# Patient Record
Sex: Female | Born: 1950 | Race: White | Hispanic: No | Marital: Married | Smoking: Current every day smoker
Health system: Southern US, Community
[De-identification: ages and names within clinical notes are randomized; demographics above are authoritative.]

## PROBLEM LIST (undated history)

## (undated) DIAGNOSIS — F329 Major depressive disorder, single episode, unspecified: Secondary | ICD-10-CM

## (undated) DIAGNOSIS — Z87442 Personal history of urinary calculi: Secondary | ICD-10-CM

## (undated) DIAGNOSIS — I10 Essential (primary) hypertension: Secondary | ICD-10-CM

## (undated) DIAGNOSIS — F32A Depression, unspecified: Secondary | ICD-10-CM

## (undated) DIAGNOSIS — E785 Hyperlipidemia, unspecified: Secondary | ICD-10-CM

## (undated) HISTORY — DX: Major depressive disorder, single episode, unspecified: F32.9

## (undated) HISTORY — DX: Depression, unspecified: F32.A

## (undated) HISTORY — DX: Hyperlipidemia, unspecified: E78.5

## (undated) HISTORY — DX: Morbid (severe) obesity due to excess calories: E66.01

---

## 1986-02-15 HISTORY — PX: ABDOMINAL HYSTERECTOMY: SHX81

## 2003-02-16 LAB — HM COLONOSCOPY: HM Colonoscopy: NORMAL

## 2006-10-14 ENCOUNTER — Ambulatory Visit: Payer: Self-pay | Admitting: Family Medicine

## 2006-12-17 LAB — HM DEXA SCAN

## 2008-03-21 ENCOUNTER — Ambulatory Visit: Payer: Self-pay | Admitting: Family Medicine

## 2010-03-23 LAB — HM PAP SMEAR: HM PAP: NORMAL

## 2010-05-11 ENCOUNTER — Ambulatory Visit: Payer: Self-pay | Admitting: Family Medicine

## 2010-05-13 ENCOUNTER — Ambulatory Visit: Payer: Self-pay | Admitting: Family Medicine

## 2011-07-05 ENCOUNTER — Ambulatory Visit: Payer: Self-pay | Admitting: Family Medicine

## 2011-07-05 LAB — HM MAMMOGRAPHY: HM MAMMO: NORMAL

## 2011-07-12 ENCOUNTER — Ambulatory Visit: Payer: Self-pay | Admitting: Family Medicine

## 2012-02-16 HISTORY — PX: KNEE SURGERY: SHX244

## 2012-09-19 ENCOUNTER — Ambulatory Visit: Payer: Self-pay | Admitting: Unknown Physician Specialty

## 2012-10-23 ENCOUNTER — Ambulatory Visit: Payer: Self-pay | Admitting: Anesthesiology

## 2012-10-25 ENCOUNTER — Ambulatory Visit: Payer: Self-pay | Admitting: Orthopedic Surgery

## 2012-10-25 LAB — POTASSIUM: Potassium: 4.9 mmol/L (ref 3.5–5.1)

## 2013-06-25 LAB — LIPID PANEL
CHOLESTEROL: 212 mg/dL — AB (ref 0–200)
HDL: 49 mg/dL (ref 35–70)
LDL CALC: 141 mg/dL
Triglycerides: 112 mg/dL (ref 40–160)

## 2013-06-25 LAB — HEMOGLOBIN A1C: HEMOGLOBIN A1C: 5.9 % (ref 4.0–6.0)

## 2014-06-07 NOTE — Op Note (Signed)
PATIENT NAME:  Veronica Mendez, Veronica Mendez MR#:  [id number] DATE OF BIRTH:  08-14-1950  DATE OF PROCEDURE:  10/25/2012  PREOPERATIVE DIAGNOSIS:  Left knee medial meniscus tear and osteoarthritis.   POSTOPERATIVE DIAGNOSIS:  Left knee medial meniscus tear and osteoarthritis.   PROCEDURE:  Left knee arthroscopy, partial medial meniscectomy.   ANESTHESIA:  General.   SURGEON:  Laurene Footman, M.D.   DESCRIPTION OF PROCEDURE:  The patient was brought to the Operating Room and after adequate anesthesia was obtained, the left leg was placed in the arthroscopic legholder with a tourniquet applied, but not required.  The leg was prepped and draped in the usual sterile fashion.  Appropriate patient identification, timeout procedures were completed.  An inferolateral portal was made and the arthroscope was introduced.  Initial inspection revealed mild patellofemoral chondromalacia with normal tracking.  No loose bodies within the gutters.  Coming around to the medial compartment, an inferomedial portal was made and a probe introduced.  There was a complex tear involving the posterior and middle thirds of the meniscus with a radial tear essentially out to near the capsule at the junction of the middle and posterior thirds.  There was mild chondromalacia in the medial compartment.  ACL was intact and lateral compartment was essentially normal.  A meniscal punch was then used to debride the medial meniscal punch and ArthroCare wand were used to debride it back to a stable margin.  There was a small Baker's cyst and with pressure it did appear that it was adequately decompressed with the partial meniscectomy.  The wound was then thoroughly irrigated with thorough irrigation of the joint.  All instrumentation was withdrawn.  The wounds were closed with simple interrupted 4-0 nylon skin sutures.  20 mL of 0.5% Sensorcaine infiltrated for postop analgesia.  Xeroform, 4 x 4's, Webril and Ace wrap applied.  The patient was sent to  the recovery room in stable condition.   ESTIMATED BLOOD LOSS:  Minimal.   COMPLICATIONS:  None.   SPECIMEN:  None.       ____________________________ Laurene Footman, MD mjm:ea D: 10/26/2012 02:06:09 ET T: 10/26/2012 03:27:15 ET JOB#: [id number]  cc: Laurene Footman, MD, <Dictator> Laurene Footman MD ELECTRONICALLY SIGNED 10/26/2012 11:12

## 2014-07-22 ENCOUNTER — Other Ambulatory Visit: Payer: Self-pay | Admitting: Family Medicine

## 2014-10-23 DIAGNOSIS — G4723 Circadian rhythm sleep disorder, irregular sleep wake type: Secondary | ICD-10-CM

## 2014-10-23 DIAGNOSIS — F411 Generalized anxiety disorder: Secondary | ICD-10-CM | POA: Insufficient documentation

## 2014-10-23 DIAGNOSIS — M158 Other polyosteoarthritis: Secondary | ICD-10-CM

## 2014-10-23 DIAGNOSIS — M1990 Unspecified osteoarthritis, unspecified site: Secondary | ICD-10-CM | POA: Insufficient documentation

## 2014-10-23 DIAGNOSIS — M545 Low back pain, unspecified: Secondary | ICD-10-CM | POA: Insufficient documentation

## 2014-10-23 DIAGNOSIS — E785 Hyperlipidemia, unspecified: Secondary | ICD-10-CM

## 2014-10-23 DIAGNOSIS — N951 Menopausal and female climacteric states: Secondary | ICD-10-CM | POA: Insufficient documentation

## 2014-10-23 DIAGNOSIS — J3089 Other allergic rhinitis: Secondary | ICD-10-CM | POA: Insufficient documentation

## 2014-10-23 DIAGNOSIS — F17200 Nicotine dependence, unspecified, uncomplicated: Secondary | ICD-10-CM

## 2014-10-23 DIAGNOSIS — F32A Depression, unspecified: Secondary | ICD-10-CM

## 2014-10-23 DIAGNOSIS — F329 Major depressive disorder, single episode, unspecified: Secondary | ICD-10-CM | POA: Insufficient documentation

## 2014-10-23 DIAGNOSIS — R928 Other abnormal and inconclusive findings on diagnostic imaging of breast: Secondary | ICD-10-CM

## 2014-10-23 DIAGNOSIS — E661 Drug-induced obesity: Secondary | ICD-10-CM | POA: Insufficient documentation

## 2014-10-23 DIAGNOSIS — E559 Vitamin D deficiency, unspecified: Secondary | ICD-10-CM

## 2014-10-23 DIAGNOSIS — E8881 Metabolic syndrome: Secondary | ICD-10-CM

## 2014-10-23 DIAGNOSIS — N952 Postmenopausal atrophic vaginitis: Secondary | ICD-10-CM | POA: Insufficient documentation

## 2014-10-23 DIAGNOSIS — N200 Calculus of kidney: Secondary | ICD-10-CM | POA: Insufficient documentation

## 2014-10-23 DIAGNOSIS — J309 Allergic rhinitis, unspecified: Secondary | ICD-10-CM

## 2014-10-23 DIAGNOSIS — I10 Essential (primary) hypertension: Secondary | ICD-10-CM | POA: Insufficient documentation

## 2014-10-23 DIAGNOSIS — G4700 Insomnia, unspecified: Secondary | ICD-10-CM | POA: Insufficient documentation

## 2014-10-23 DIAGNOSIS — M797 Fibromyalgia: Secondary | ICD-10-CM | POA: Insufficient documentation

## 2014-10-23 DIAGNOSIS — M6530 Trigger finger, unspecified finger: Secondary | ICD-10-CM | POA: Insufficient documentation

## 2014-10-23 DIAGNOSIS — G5600 Carpal tunnel syndrome, unspecified upper limb: Secondary | ICD-10-CM

## 2014-10-26 ENCOUNTER — Encounter: Payer: Self-pay | Admitting: Family Medicine

## 2014-10-26 DIAGNOSIS — E668 Other obesity: Secondary | ICD-10-CM | POA: Insufficient documentation

## 2014-10-26 DIAGNOSIS — M654 Radial styloid tenosynovitis [de Quervain]: Secondary | ICD-10-CM | POA: Insufficient documentation

## 2014-10-26 DIAGNOSIS — M47817 Spondylosis without myelopathy or radiculopathy, lumbosacral region: Secondary | ICD-10-CM | POA: Insufficient documentation

## 2014-10-26 DIAGNOSIS — E559 Vitamin D deficiency, unspecified: Secondary | ICD-10-CM | POA: Insufficient documentation

## 2014-10-28 ENCOUNTER — Encounter: Payer: Self-pay | Admitting: Family Medicine

## 2014-10-28 ENCOUNTER — Ambulatory Visit (INDEPENDENT_AMBULATORY_CARE_PROVIDER_SITE_OTHER): Payer: Federal, State, Local not specified - PPO | Admitting: Family Medicine

## 2014-10-28 VITALS — BP 122/68 | HR 89 | Temp 98.1°F | Resp 16 | Ht 63.0 in | Wt 237.5 lb

## 2014-10-28 DIAGNOSIS — M797 Fibromyalgia: Secondary | ICD-10-CM | POA: Diagnosis not present

## 2014-10-28 DIAGNOSIS — G4700 Insomnia, unspecified: Secondary | ICD-10-CM

## 2014-10-28 DIAGNOSIS — Z1211 Encounter for screening for malignant neoplasm of colon: Secondary | ICD-10-CM

## 2014-10-28 DIAGNOSIS — F411 Generalized anxiety disorder: Secondary | ICD-10-CM | POA: Diagnosis not present

## 2014-10-28 DIAGNOSIS — J309 Allergic rhinitis, unspecified: Secondary | ICD-10-CM | POA: Diagnosis not present

## 2014-10-28 DIAGNOSIS — J3089 Other allergic rhinitis: Secondary | ICD-10-CM

## 2014-10-28 DIAGNOSIS — E785 Hyperlipidemia, unspecified: Secondary | ICD-10-CM | POA: Diagnosis not present

## 2014-10-28 DIAGNOSIS — I10 Essential (primary) hypertension: Secondary | ICD-10-CM | POA: Diagnosis not present

## 2014-10-28 DIAGNOSIS — Z1239 Encounter for other screening for malignant neoplasm of breast: Secondary | ICD-10-CM | POA: Diagnosis not present

## 2014-10-28 DIAGNOSIS — Z23 Encounter for immunization: Secondary | ICD-10-CM

## 2014-10-28 DIAGNOSIS — M5412 Radiculopathy, cervical region: Secondary | ICD-10-CM | POA: Diagnosis not present

## 2014-10-28 MED ORDER — ESCITALOPRAM OXALATE 20 MG PO TABS: 20.0000 mg | ORAL_TABLET | Freq: Every day | ORAL | Status: DC

## 2014-10-28 MED ORDER — LEVOCETIRIZINE DIHYDROCHLORIDE 5 MG PO TABS: 5.0000 mg | ORAL_TABLET | Freq: Every day | ORAL | Status: DC

## 2014-10-28 MED ORDER — VALSARTAN-HYDROCHLOROTHIAZIDE 320-25 MG PO TABS: 1.0000 | ORAL_TABLET | Freq: Every day | ORAL | Status: DC

## 2014-10-28 MED ORDER — HYDROCODONE-ACETAMINOPHEN 5-325 MG PO TABS: 1.0000 | ORAL_TABLET | Freq: Four times a day (QID) | ORAL | Status: DC | PRN

## 2014-10-28 MED ORDER — PREDNISONE 10 MG (48) PO TBPK: ORAL_TABLET | Freq: Every day | ORAL | Status: DC

## 2014-10-28 MED ORDER — ALPRAZOLAM 0.5 MG PO TABS: 0.5000 mg | ORAL_TABLET | ORAL | Status: DC | PRN

## 2014-10-28 NOTE — Progress Notes (Signed)
Name: Veronica Mendez   MRN: [id number]    DOB: 1950/03/08   Date:10/28/2014       Progress Note  Subjective  Chief Complaint  Chief Complaint  Patient presents with  . Medication Refill    follow-up  . Hypertension  . Hyperlipidemia  . Depression  . Anxiety  . Allergic Rhinitis   . Neck Pain    onset 3-weeks seeing chiropractor which states vertebrae out of place affecting her right should and arm    HPI  HTN: bp is at goal, but Benicar HCTZ, however she states the medication is too expensive and would like to change to a cheaper medication.   Denies side effects of medication   Hyperlipidemia: taking Pravastatin daily now, denies myalgia.   Depression/GAD: she states depression is under control, she retired now, and is feeling well, worries all the time, she is an anxious person, but is stable at this time. Sometimes she smokes because of stress, advised to take alprazolam prn   AR: she is taking medication and states symptoms are controlled  FMS: she feels better during Summer months, she states her body pain is about 4 /10   Cervical radiculitis: symptoms started 5 weeks ago, she felt at home - tripped on a cord that was in the middle of the floor- she has towels on her hands, and fell on her face. She states that when she got up she was feeling well, but one week later she started to have some neck stiffness and got progressively worse. She went to the chiropractor 3 weeks ago ( Dr. Freddi Che ) but pain is not improving. The pain goes from her neck to left shoulder blade , that is described an aching, that goes down to left arm, and numbness down to 4th and 5th fingers of left hand. Pain improves when she places her left hand above her head.     Patient Active Problem List   Diagnosis Date Noted  . Radiculitis of left cervical region 10/28/2014  . Vitamin D deficiency 10/26/2014  . Lumbosacral spondylosis without myelopathy 10/26/2014  . Extreme obesity 10/26/2014  . De  Quervain's disease (radial styloid tenosynovitis) 10/26/2014  . Nephrolithiasis 10/23/2014  . Trigger finger, right 10/23/2014  . Carpal tunnel syndrome 10/23/2014  . Generalized anxiety disorder 10/23/2014  . Depression 10/23/2014  . Fibromyalgia 10/23/2014  . Perennial allergic rhinitis 10/23/2014  . Dyslipidemia 10/23/2014  . Benign hypertension 10/23/2014  . Morbid drug-induced obesity 10/23/2014  . 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 Insomnia 10/23/2014  . Metabolic syndrome 78/29/5621  . Tobacco use disorder 10/23/2014  . Menopause syndrome 10/23/2014  . Lumbago 10/23/2014  . Osteoarthritis 10/23/2014  . Atrophic vaginitis 10/23/2014    History reviewed. No pertinent past surgical history.  History reviewed. No pertinent family history.  Social History   Social History  . Marital Status: Married    Spouse Name: N/A  . Number of Children: N/A  . Years of Education: N/A   Occupational History  . Not on file.   Social History Main Topics  . Smoking status: Current Some Day Smoker  . Smokeless tobacco: Never Used  . Alcohol Use: 0.0 oz/week    0 Standard drinks or equivalent per week  . Drug Use: No  . Sexual Activity: Yes   Other Topics Concern  . Not on file   Social History Narrative  . No narrative on file     Current outpatient prescriptions:  .  ALPRAZolam (XANAX) 0.5  MG tablet, Take 1 tablet (0.5 mg total) by mouth as needed for anxiety., Disp: 60 tablet, Rfl: 0 .  Cholecalciferol (VITAMIN D) 2000 UNITS CAPS, Take 1 tablet by mouth daily., Disp: , Rfl:  .  escitalopram (LEXAPRO) 20 MG tablet, Take 1 tablet (20 mg total) by mouth daily., Disp: 90 tablet, Rfl: 1 .  HYDROcodone-acetaminophen (NORCO/VICODIN) 5-325 MG per tablet, Take 1 tablet by mouth every 6 (six) hours as needed for severe pain., Disp: 20 tablet, Rfl: 0 .  ibuprofen (ADVIL,MOTRIN) 800 MG tablet, Take 1 tablet by mouth as needed., Disp: , Rfl:  .  levocetirizine (XYZAL) 5 MG  tablet, Take 1 tablet (5 mg total) by mouth daily., Disp: 90 tablet, Rfl: 1 .  pravastatin (PRAVACHOL) 40 MG tablet, TAKE 1 TABLET BY MOUTH EVERY EVENING, Disp: 90 tablet, Rfl: 1 .  vitamin E 100 UNIT capsule, Take by mouth daily., Disp: , Rfl:  .  predniSONE (STERAPRED UNI-PAK 48 TAB) 10 MG (48) TBPK tablet, Take by mouth daily., Disp: 48 tablet, Rfl: 0 .  valsartan-hydrochlorothiazide (DIOVAN-HCT) 320-25 MG per tablet, Take 1 tablet by mouth daily., Disp: 90 tablet, Rfl: 1  No Active Allergies   ROS  Constitutional: Negative for fever - gained weight since last visit  Respiratory: Negative for cough and shortness of breath.   Cardiovascular: Negative for chest pain or palpitations.  Gastrointestinal: Negative for abdominal pain, no bowel changes.  Musculoskeletal: Negative for gait problem or joint swelling.  Skin: Negative for rash.  Neurological: Negative for dizziness or headache.  No other specific complaints in a complete review of systems (except as listed in HPI above).  Objective  Filed Vitals:   10/28/14 0913  BP: 122/68  Pulse: 89  Temp: 98.1 F (36.7 C)  TempSrc: Oral  Resp: 16  Height: 5\' 3"  (1.6 m)  Weight: 237 lb 8 oz (107.729 kg)  SpO2: 95%    Body mass index is 42.08 kg/(m^2).  Physical Exam  Constitutional: Patient appears well-developed and well-nourished. Obese No distress.  HEENT: head atraumatic, normocephalic, pupils equal and reactive to light, throat within normal limits Cardiovascular: Normal rate, regular rhythm and normal heart sounds.  No murmur heard. No BLE edema. Pulmonary/Chest: Effort normal and breath sounds normal. No respiratory distress. Abdominal: Soft.  There is no tenderness. Psychiatric: Patient has a normal mood and affect. behavior is normal. Judgment and thought content normal. Muscular Skeletal: pain during palpation of neck, decrease rom of neck, normal grip, paresthesia left lateral upper arm ( triceps area  )   PHQ2/9: Depression screen PHQ 2/9 10/28/2014  Decreased Interest 0  Down, Depressed, Hopeless 0  PHQ - 2 Score 0     Fall Risk: Fall Risk  10/28/2014  Falls in the past year? Yes  Number falls in past yr: 1  Injury with Fall? Yes      Functional Status Survey: Is the patient deaf or have difficulty hearing?: No Does the patient have difficulty seeing, even when wearing glasses/contacts?: Yes (glasses) Does the patient have difficulty concentrating, remembering, or making decisions?: No Does the patient have difficulty walking or climbing stairs?: No Does the patient have difficulty dressing or bathing?: No Does the patient have difficulty doing errands alone such as visiting a doctor's office or shopping?: No    Assessment & Plan  1. Benign hypertension Change because of cost - valsartan-hydrochlorothiazide (DIOVAN-HCT) 320-25 MG per tablet; Take 1 tablet by mouth daily.  Dispense: 90 tablet; Refill: 1  2. Needs flu  shot  - Flu Vaccine QUAD 36+ mos PF IM (Fluarix & Fluzone Quad PF)  3. Morbid  obesity Needs to decrease portion size, give her left overs away - she likes to eat food, exercise more  4. Fibromyalgia stable  5. Dyslipidemia Continue medication  6. Radiculitis of left cervical region  - HYDROcodone-acetaminophen (NORCO/VICODIN) 5-325 MG per tablet; Take 1 tablet by mouth every 6 (six) hours as needed for severe pain.  Dispense: 20 tablet; Refill: 0 - predniSONE (STERAPRED UNI-PAK 48 TAB) 10 MG (48) TBPK tablet; Take by mouth daily.  Dispense: 48 tablet; Refill: 0  7. Insomnia Doing better, now that she quit smoking  8. Generalized anxiety disorder Continue medication, advised to try taking half of Alprazolam when needed to avoid smoking when anxious. #60 to last at least 3 months - escitalopram (LEXAPRO) 20 MG tablet; Take 1 tablet (20 mg total) by mouth daily.  Dispense: 90 tablet; Refill: 1 - ALPRAZolam (XANAX) 0.5 MG tablet; Take 1 tablet  (0.5 mg total) by mouth as needed for anxiety.  Dispense: 60 tablet; Refill: 0  9. Perennial allergic rhinitis  - levocetirizine (XYZAL) 5 MG tablet; Take 1 tablet (5 mg total) by mouth daily.  Dispense: 90 tablet; Refill: 1  10. Colon cancer screening She refused colonoscopy, we will try Colguard, she will check about coverage with her insurance first - Cologuard  11. Breast cancer screening  - MM Digital Screening; Future

## 2015-01-19 ENCOUNTER — Other Ambulatory Visit: Payer: Self-pay | Admitting: Family Medicine

## 2015-01-20 NOTE — Telephone Encounter (Signed)
Patient requesting refill. 

## 2015-03-19 ENCOUNTER — Encounter: Payer: Federal, State, Local not specified - PPO | Admitting: Family Medicine

## 2015-05-09 ENCOUNTER — Encounter: Payer: Self-pay | Admitting: Family Medicine

## 2015-05-09 ENCOUNTER — Ambulatory Visit (INDEPENDENT_AMBULATORY_CARE_PROVIDER_SITE_OTHER): Payer: Federal, State, Local not specified - PPO | Admitting: Family Medicine

## 2015-05-09 VITALS — BP 116/70 | HR 92 | Temp 98.6°F | Resp 16 | Wt 247.2 lb

## 2015-05-09 DIAGNOSIS — Z0000 Encounter for general adult medical examination without abnormal findings: Secondary | ICD-10-CM | POA: Diagnosis not present

## 2015-05-09 DIAGNOSIS — J3089 Other allergic rhinitis: Secondary | ICD-10-CM

## 2015-05-09 DIAGNOSIS — M797 Fibromyalgia: Secondary | ICD-10-CM

## 2015-05-09 DIAGNOSIS — Z1211 Encounter for screening for malignant neoplasm of colon: Secondary | ICD-10-CM

## 2015-05-09 DIAGNOSIS — Z1239 Encounter for other screening for malignant neoplasm of breast: Secondary | ICD-10-CM

## 2015-05-09 DIAGNOSIS — G4700 Insomnia, unspecified: Secondary | ICD-10-CM

## 2015-05-09 DIAGNOSIS — Z23 Encounter for immunization: Secondary | ICD-10-CM

## 2015-05-09 DIAGNOSIS — I10 Essential (primary) hypertension: Secondary | ICD-10-CM | POA: Diagnosis not present

## 2015-05-09 DIAGNOSIS — E785 Hyperlipidemia, unspecified: Secondary | ICD-10-CM | POA: Diagnosis not present

## 2015-05-09 DIAGNOSIS — J309 Allergic rhinitis, unspecified: Secondary | ICD-10-CM

## 2015-05-09 DIAGNOSIS — E8881 Metabolic syndrome: Secondary | ICD-10-CM

## 2015-05-09 DIAGNOSIS — E559 Vitamin D deficiency, unspecified: Secondary | ICD-10-CM | POA: Diagnosis not present

## 2015-05-09 DIAGNOSIS — F411 Generalized anxiety disorder: Secondary | ICD-10-CM | POA: Diagnosis not present

## 2015-05-09 DIAGNOSIS — M5412 Radiculopathy, cervical region: Secondary | ICD-10-CM

## 2015-05-09 DIAGNOSIS — Z01419 Encounter for gynecological examination (general) (routine) without abnormal findings: Secondary | ICD-10-CM

## 2015-05-09 DIAGNOSIS — R928 Other abnormal and inconclusive findings on diagnostic imaging of breast: Secondary | ICD-10-CM

## 2015-05-09 MED ORDER — PRAVASTATIN SODIUM 40 MG PO TABS: 40.0000 mg | ORAL_TABLET | Freq: Every evening | ORAL | Status: DC

## 2015-05-09 MED ORDER — HYDROCODONE-ACETAMINOPHEN 5-325 MG PO TABS: 1.0000 | ORAL_TABLET | Freq: Four times a day (QID) | ORAL | Status: DC | PRN

## 2015-05-09 MED ORDER — ESCITALOPRAM OXALATE 20 MG PO TABS
20.0000 mg | ORAL_TABLET | Freq: Every day | ORAL | Status: DC
Start: 2015-05-09 — End: 2015-10-31

## 2015-05-09 MED ORDER — LEVOCETIRIZINE DIHYDROCHLORIDE 5 MG PO TABS: 5.0000 mg | ORAL_TABLET | Freq: Every day | ORAL | Status: DC

## 2015-05-09 MED ORDER — ALPRAZOLAM 0.5 MG PO TABS: 0.5000 mg | ORAL_TABLET | ORAL | Status: DC | PRN

## 2015-05-09 MED ORDER — VALSARTAN-HYDROCHLOROTHIAZIDE 320-25 MG PO TABS: 1.0000 | ORAL_TABLET | Freq: Every day | ORAL | Status: DC

## 2015-05-09 NOTE — Progress Notes (Signed)
Name: Veronica Mendez   MRN: [id number]    DOB: Nov 22, 1950   Date:05/09/2015       Progress Note  Subjective  Chief Complaint  Chief Complaint  Patient presents with  . Annual Exam    HPI  Well woman: needs colonoscopy and mammogram ( previous ones checked by Dr. Marina Gravel ), no breast symptoms,no vaginal discharge, she has urinary frequency ( but because she drinks a lot of water ) no urgency, no incontinence. Not currently sexually active.  HTN: bp is at goal, but Benicar HCTZ, however she states the medication is too expensive and would like to change to a cheaper medication.No chest pain or palpitation  Denies side effects of medication   Hyperlipidemia: taking Pravastatin daily now, denies myalgia.   Depression/GAD: she states depression is under control, she retired now, and is feeling well, still  worries all the time, she is an anxious person, she was feeling worse for a period of time carrying for her mother-in-law but is back to baseline now.  Sometimes she smokes because of stress, advised to take alprazolam prn   AR: she is taking medication and states symptoms are controlled. No nasal congestion, occasional nasal congestion and rhinorrhea, when outside in the yard  FMS: she feels better during Summer months, the cold weather makes symptoms worse. She states her body pain is about 0/10, she is doing better since she retired because she can rest more often during the day, average 2/10 , taking more Ibuprofen to control symptoms. - taking twice daily. Very seldom takes Hydrocodone  Obesity: she has gained 10 lbs, she states she loves to eat.    Cervical Radiculitis: used to go down left arm, still going to chiropractor once a month and is doing well at this time, no weakness or tingling at this time. She takes medication prn only   Patient Active Problem List   Diagnosis Date Noted  . Radiculitis of left cervical region 10/28/2014  . Vitamin D deficiency 10/26/2014  .  Lumbosacral spondylosis without myelopathy 10/26/2014  . Extreme obesity ([place]) 10/26/2014  . De Quervain's disease (radial styloid tenosynovitis) 10/26/2014  . Nephrolithiasis 10/23/2014  . Trigger finger, right 10/23/2014  . Carpal tunnel syndrome 10/23/2014  . Generalized anxiety disorder 10/23/2014  . Depression 10/23/2014  . Fibromyalgia 10/23/2014  . Perennial allergic rhinitis 10/23/2014  . Dyslipidemia 10/23/2014  . Benign hypertension 10/23/2014  . Morbid drug-induced obesity 10/23/2014  . 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 Insomnia 10/23/2014  . Metabolic syndrome 99991111  . Tobacco use disorder 10/23/2014  . Menopause syndrome 10/23/2014  . Lumbago 10/23/2014  . Osteoarthritis 10/23/2014  . Atrophic vaginitis 10/23/2014    History reviewed. No pertinent past surgical history.  History reviewed. No pertinent family history.  Social History   Social History  . Marital Status: Married    Spouse Name: N/A  . Number of Children: N/A  . Years of Education: N/A   Occupational History  . Not on file.   Social History Main Topics  . Smoking status: Current Some Day Smoker  . Smokeless tobacco: Never Used  . Alcohol Use: 0.0 oz/week    0 Standard drinks or equivalent per week  . Drug Use: No  . Sexual Activity: Yes   Other Topics Concern  . Not on file   Social History Narrative     Current outpatient prescriptions:  .  ALPRAZolam (XANAX) 0.5 MG tablet, Take 1 tablet (0.5 mg total) by mouth as needed for  anxiety., Disp: 60 tablet, Rfl: 0 .  Calcium Citrate-Vitamin D (CALCIUM + D PO), Take by mouth., Disp: , Rfl:  .  Cholecalciferol (VITAMIN D) 2000 UNITS CAPS, Take 1 tablet by mouth daily., Disp: , Rfl:  .  escitalopram (LEXAPRO) 20 MG tablet, Take 1 tablet (20 mg total) by mouth daily., Disp: 90 tablet, Rfl: 1 .  ibuprofen (ADVIL,MOTRIN) 800 MG tablet, Take 1 tablet by mouth as needed. Reported on 05/09/2015, Disp: , Rfl:  .  levocetirizine  (XYZAL) 5 MG tablet, Take 1 tablet (5 mg total) by mouth daily., Disp: 90 tablet, Rfl: 1 .  pravastatin (PRAVACHOL) 40 MG tablet, TAKE 1 TABLET BY MOUTH EVERY EVENING, Disp: 90 tablet, Rfl: 1 .  valsartan-hydrochlorothiazide (DIOVAN-HCT) 320-25 MG per tablet, Take 1 tablet by mouth daily., Disp: 90 tablet, Rfl: 1 .  vitamin E 100 UNIT capsule, Take by mouth daily., Disp: , Rfl:  .  HYDROcodone-acetaminophen (NORCO/VICODIN) 5-325 MG per tablet, Take 1 tablet by mouth every 6 (six) hours as needed for severe pain. (Patient not taking: Reported on 05/09/2015), Disp: 20 tablet, Rfl: 0  No Known Allergies   ROS  Constitutional: Negative for fever, positive for weight change.  Respiratory: Negative for cough and shortness of breath.   Cardiovascular: Negative for chest pain or palpitations.  Gastrointestinal: Negative for abdominal pain, no bowel changes.  Musculoskeletal: Negative for gait problem or joint swelling.  Skin: Negative for rash.  Neurological: Negative for dizziness or headache.  No other specific complaints in a complete review of systems (except as listed in HPI above).  Objective  Filed Vitals:   05/09/15 1108  BP: 116/70  Pulse: 92  Temp: 98.6 F (37 C)  TempSrc: Oral  Resp: 16  Weight: 247 lb 3.2 oz (112.129 kg)  SpO2: 98%    Body mass index is 43.8 kg/(m^2).  Physical Exam  Constitutional: Patient appears well-developed and obese. No distress.  HENT: Head: Normocephalic and atraumatic. Ears: B TMs ok, no erythema or effusion; Nose: Nose normal. Mouth/Throat: Oropharynx is clear and moist. No oropharyngeal exudate.  Eyes: Conjunctivae and EOM are normal. Pupils are equal, round, and reactive to light. No scleral icterus.  Neck: Normal range of motion. Neck supple. No JVD present. No thyromegaly present.  Cardiovascular: Normal rate, regular rhythm and normal heart sounds.  No murmur heard. No BLE edema. Pulmonary/Chest: Effort normal and breath sounds normal. No  respiratory distress. Abdominal: Soft. Bowel sounds are normal, no distension. There is no tenderness. no masses Breast: no lumps or masses, no nipple discharge or rashes FEMALE GENITALIA:  External genitalia normal External urethra normal RECTAL: not done Musculoskeletal: Normal range of motion, no joint effusions. No gross deformities Neurological: he is alert and oriented to person, place, and time. No cranial nerve deficit. Coordination, balance, strength, speech and gait are normal.  Skin: Skin is warm and dry. No rash noted. No erythema. She has multiple moles, she keeps a check - no changes, previously seen by Dermatologist and denies skin cancer Psychiatric: Patient has a normal mood and affect. behavior is normal. Judgment and thought content normal.   PHQ2/9: Depression screen [facility] 2/9 05/09/2015 10/28/2014  Decreased Interest 0 0  Down, Depressed, Hopeless 1 0  PHQ - 2 Score 1 0     Fall Risk: Fall Risk  05/09/2015 10/28/2014  Falls in the past year? No Yes  Number falls in past yr: - 1  Injury with Fall? - Yes     Functional Status Survey:  Is the patient deaf or have difficulty hearing?: No Does the patient have difficulty seeing, even when wearing glasses/contacts?: No Does the patient have difficulty concentrating, remembering, or making decisions?: No Does the patient have difficulty walking or climbing stairs?: No Does the patient have difficulty dressing or bathing?: No Does the patient have difficulty doing errands alone such as visiting a doctor's office or shopping?: No    Assessment & Plan  1. Well woman exam  Discussed importance of 150 minutes of physical activity weekly, eat two servings of fish weekly, eat one serving of tree nuts ( cashews, pistachios, pecans, almonds.Marland Kitchen) every other day, eat 6 servings of fruit/vegetables daily and drink plenty of water and avoid sweet beverages.   2. Generalized anxiety disorder  - escitalopram (LEXAPRO) 20 MG  tablet; Take 1 tablet (20 mg total) by mouth daily.  Dispense: 90 tablet; Refill: 1 - ALPRAZolam (XANAX) 0.5 MG tablet; Take 1 tablet (0.5 mg total) by mouth as needed for anxiety.  Dispense: 60 tablet; Refill: 0  3. Perennial allergic rhinitis  - levocetirizine (XYZAL) 5 MG tablet; Take 1 tablet (5 mg total) by mouth daily.  Dispense: 90 tablet; Refill: 1  4. Benign hypertension  - valsartan-hydrochlorothiazide (DIOVAN-HCT) 320-25 MG tablet; Take 1 tablet by mouth daily.  Dispense: 90 tablet; Refill: 1 - Comprehensive metabolic panel  5. Breast cancer screening  - MM Digital Screening; Future  6. Colon cancer screening   - Ambulatory referral to Gastroenterology  7. Metabolic syndrome  - Hemoglobin A1c  8. Insomnia  Stable at this time  9. Dyslipidemia  - Lipid panel  10. Vitamin D deficiency  - VITAMIN D 25 Hydroxy (Vit-D Deficiency, Fractures)  11. Need for pneumococcal vaccination  - Pneumococcal conjugate vaccine 13-valent IM  12. Morbid obesity, unspecified obesity type ([place])  Discussed life style modification   13. Radiculitis of left cervical region  - HYDROcodone-acetaminophen (NORCO/VICODIN) 5-325 MG tablet; Take 1 tablet by mouth every 6 (six) hours as needed for severe pain.  Dispense: 20 tablet; Refill: 0  14. Fibromyalgia  A little worse when cold

## 2015-05-10 LAB — HEMOGLOBIN A1C
ESTIMATED AVERAGE GLUCOSE: 120 mg/dL
Hgb A1c MFr Bld: 5.8 % — ABNORMAL HIGH (ref 4.8–5.6)

## 2015-05-10 LAB — COMPREHENSIVE METABOLIC PANEL
A/G RATIO: 1.9 (ref 1.2–2.2)
ALBUMIN: 4.4 g/dL (ref 3.6–4.8)
ALT: 28 IU/L (ref 0–32)
AST: 22 IU/L (ref 0–40)
Alkaline Phosphatase: 90 IU/L (ref 39–117)
BUN/Creatinine Ratio: 31 — ABNORMAL HIGH (ref 11–26)
BUN: 19 mg/dL (ref 8–27)
Bilirubin Total: 0.2 mg/dL (ref 0.0–1.2)
CALCIUM: 10 mg/dL (ref 8.7–10.3)
CO2: 24 mmol/L (ref 18–29)
CREATININE: 0.61 mg/dL (ref 0.57–1.00)
Chloride: 101 mmol/L (ref 96–106)
GFR, EST AFRICAN AMERICAN: 110 mL/min/{1.73_m2} (ref >59)
GFR, EST NON AFRICAN AMERICAN: 95 mL/min/{1.73_m2} (ref >59)
GLOBULIN, TOTAL: 2.3 g/dL (ref 1.5–4.5)
Glucose: 95 mg/dL (ref 65–99)
POTASSIUM: 5.3 mmol/L — AB (ref 3.5–5.2)
SODIUM: 140 mmol/L (ref 134–144)
TOTAL PROTEIN: 6.7 g/dL (ref 6.0–8.5)

## 2015-05-10 LAB — LIPID PANEL
CHOLESTEROL TOTAL: 185 mg/dL (ref 100–199)
Chol/HDL Ratio: 3.6 ratio units (ref 0.0–4.4)
HDL: 52 mg/dL (ref >39)
LDL Calculated: 106 mg/dL — ABNORMAL HIGH (ref 0–99)
TRIGLYCERIDES: 134 mg/dL (ref 0–149)
VLDL Cholesterol Cal: 27 mg/dL (ref 5–40)

## 2015-05-10 LAB — VITAMIN D 25 HYDROXY (VIT D DEFICIENCY, FRACTURES): Vit D, 25-Hydroxy: 24.5 ng/mL — ABNORMAL LOW (ref 30.0–100.0)

## 2015-05-14 ENCOUNTER — Other Ambulatory Visit: Payer: Self-pay

## 2015-05-14 ENCOUNTER — Telehealth: Payer: Self-pay

## 2015-05-14 NOTE — Telephone Encounter (Signed)
Gastroenterology Pre-Procedure Review  Request Date: 06/05/15 Requesting Physician: Dr. Ancil Boozer  PATIENT REVIEW QUESTIONS: The patient responded to the following health history questions as indicated:    1. Are you having any GI issues? no 2. Do you have a personal history of Polyps? no 3. Do you have a family history of Colon Cancer or Polyps? no 4. Diabetes Mellitus? no 5. Joint replacements in the past 12 months?no 6. Major health problems in the past 3 months?no 7. Any artificial heart valves, MVP, or defibrillator?no    MEDICATIONS & ALLERGIES:    Patient reports the following regarding taking any anticoagulation/antiplatelet therapy:   Plavix, Coumadin, Eliquis, Xarelto, Lovenox, Pradaxa, Brilinta, or Effient? no Aspirin? no  Patient confirms/reports the following medications:  Current Outpatient Prescriptions  Medication Sig Dispense Refill  . ALPRAZolam (XANAX) 0.5 MG tablet Take 1 tablet (0.5 mg total) by mouth as needed for anxiety. 60 tablet 0  . Calcium Citrate-Vitamin D (CALCIUM + D PO) Take by mouth.    . Cholecalciferol (VITAMIN D) 2000 UNITS CAPS Take 1 tablet by mouth daily.    Marland Kitchen escitalopram (LEXAPRO) 20 MG tablet Take 1 tablet (20 mg total) by mouth daily. 90 tablet 1  . HYDROcodone-acetaminophen (NORCO/VICODIN) 5-325 MG tablet Take 1 tablet by mouth every 6 (six) hours as needed for severe pain. 20 tablet 0  . ibuprofen (ADVIL,MOTRIN) 800 MG tablet Take 1 tablet by mouth as needed. Reported on 05/09/2015    . levocetirizine (XYZAL) 5 MG tablet Take 1 tablet (5 mg total) by mouth daily. 90 tablet 1  . pravastatin (PRAVACHOL) 40 MG tablet Take 1 tablet (40 mg total) by mouth every evening. 90 tablet 1  . valsartan-hydrochlorothiazide (DIOVAN-HCT) 320-25 MG tablet Take 1 tablet by mouth daily. 90 tablet 1  . vitamin E 100 UNIT capsule Take by mouth daily.     No current facility-administered medications for this visit.    Patient confirms/reports the following  allergies:  No Known Allergies  No orders of the defined types were placed in this encounter.    AUTHORIZATION INFORMATION Primary Insurance: 1D#: Group #:  Secondary Insurance: 1D#: Group #:  SCHEDULE INFORMATION: Date: 06/05/15 Time: Location: [place]

## 2015-05-28 ENCOUNTER — Other Ambulatory Visit: Payer: Self-pay | Admitting: Family Medicine

## 2015-05-28 NOTE — Telephone Encounter (Signed)
Patient requesting refill. 

## 2015-05-29 ENCOUNTER — Telehealth: Payer: Self-pay | Admitting: Gastroenterology

## 2015-05-29 ENCOUNTER — Telehealth: Payer: Self-pay | Admitting: Family Medicine

## 2015-05-29 NOTE — Telephone Encounter (Signed)
Patient left a voice message that she would like to talk to you. No reason given. °

## 2015-05-29 NOTE — Telephone Encounter (Signed)
Results were printed and mailed to the address on file.

## 2015-05-29 NOTE — Telephone Encounter (Signed)
Requesting that you mail a copy of her most recent lab results. Address on file is correct.

## 2015-06-02 NOTE — Telephone Encounter (Signed)
Pt notified about the precert for her colonoscopy that is scheduled for Thursday. Advised her she should contact them to check benefits for other services that will be provided that day.

## 2015-06-02 NOTE — Telephone Encounter (Signed)
No authorization is required with [facility] for CPT: [id number] as long as it is done on an outpatient bases.

## 2015-06-02 NOTE — Telephone Encounter (Signed)
Pt wanted to make sure her colonoscopy was pre certified. Will you check to see if precert if required?

## 2015-06-03 ENCOUNTER — Telehealth: Payer: Self-pay | Admitting: Gastroenterology

## 2015-06-03 NOTE — Telephone Encounter (Signed)
Patient called to cancel her colonoscopy due to an illness. She will call back to reschedule. Called [facility] to let them know.

## 2015-06-03 NOTE — Telephone Encounter (Signed)
Noted! Thank you

## 2015-06-04 NOTE — Discharge Instructions (Signed)

## 2015-06-05 ENCOUNTER — Ambulatory Visit
Admission: RE | Admit: 2015-06-05 | Payer: Federal, State, Local not specified - PPO | Source: Ambulatory Visit | Admitting: Gastroenterology

## 2015-06-05 ENCOUNTER — Encounter: Admission: RE | Payer: Self-pay | Source: Ambulatory Visit

## 2015-06-05 SURGERY — COLONOSCOPY WITH PROPOFOL
Anesthesia: Choice

## 2015-07-09 ENCOUNTER — Encounter: Payer: Self-pay | Admitting: Family Medicine

## 2015-07-09 DIAGNOSIS — R739 Hyperglycemia, unspecified: Secondary | ICD-10-CM | POA: Insufficient documentation

## 2015-10-07 ENCOUNTER — Ambulatory Visit: Payer: Federal, State, Local not specified - PPO

## 2015-10-07 ENCOUNTER — Other Ambulatory Visit: Payer: Federal, State, Local not specified - PPO

## 2015-10-31 ENCOUNTER — Other Ambulatory Visit: Payer: Self-pay | Admitting: Family Medicine

## 2015-10-31 DIAGNOSIS — J3089 Other allergic rhinitis: Secondary | ICD-10-CM

## 2015-10-31 DIAGNOSIS — F411 Generalized anxiety disorder: Secondary | ICD-10-CM

## 2015-10-31 DIAGNOSIS — I10 Essential (primary) hypertension: Secondary | ICD-10-CM

## 2015-11-10 ENCOUNTER — Ambulatory Visit (INDEPENDENT_AMBULATORY_CARE_PROVIDER_SITE_OTHER): Payer: Federal, State, Local not specified - PPO | Admitting: Family Medicine

## 2015-11-10 ENCOUNTER — Encounter: Payer: Self-pay | Admitting: Family Medicine

## 2015-11-10 VITALS — BP 118/74 | HR 100 | Temp 98.3°F | Resp 16 | Ht 63.0 in | Wt 244.2 lb

## 2015-11-10 DIAGNOSIS — M797 Fibromyalgia: Secondary | ICD-10-CM | POA: Diagnosis not present

## 2015-11-10 DIAGNOSIS — I10 Essential (primary) hypertension: Secondary | ICD-10-CM

## 2015-11-10 DIAGNOSIS — E8881 Metabolic syndrome: Secondary | ICD-10-CM

## 2015-11-10 DIAGNOSIS — F411 Generalized anxiety disorder: Secondary | ICD-10-CM | POA: Diagnosis not present

## 2015-11-10 DIAGNOSIS — E66813 Obesity, class 3: Secondary | ICD-10-CM

## 2015-11-10 DIAGNOSIS — F5081 Binge eating disorder: Secondary | ICD-10-CM | POA: Diagnosis not present

## 2015-11-10 DIAGNOSIS — G4700 Insomnia, unspecified: Secondary | ICD-10-CM | POA: Diagnosis not present

## 2015-11-10 DIAGNOSIS — E785 Hyperlipidemia, unspecified: Secondary | ICD-10-CM

## 2015-11-10 DIAGNOSIS — Z23 Encounter for immunization: Secondary | ICD-10-CM | POA: Diagnosis not present

## 2015-11-10 HISTORY — DX: Morbid (severe) obesity due to excess calories: E66.01

## 2015-11-10 HISTORY — DX: Obesity, class 3: E66.813

## 2015-11-10 MED ORDER — PRAVASTATIN SODIUM 40 MG PO TABS: 40.0000 mg | ORAL_TABLET | Freq: Every evening | ORAL | 1 refills | Status: DC

## 2015-11-10 MED ORDER — LISDEXAMFETAMINE DIMESYLATE 20 MG PO CAPS: 20.0000 mg | ORAL_CAPSULE | Freq: Every day | ORAL | 0 refills | Status: DC

## 2015-11-10 NOTE — Progress Notes (Signed)
Name: Veronica Mendez   MRN: [id number]    DOB: 1950-02-17   Date:11/10/2015       Progress Note  Subjective  Chief Complaint  Chief Complaint  Patient presents with  . Hypertension    pt here for 6 month follow up  . Hyperlipidemia  . Pain  . Flu Vaccine  . Depression    HPI  HTN: bp is at goal, taking Valsartan HCTZ and bp is at goal, she denies chest pain or palpitation   Hyperlipidemia: taking Pravastatin daily now, and last LDL was at goal   Depression/GAD: she states depression is under control, she retired now, and is feeling well, still  worries all the time, she is an anxious person.  Sometimes she smokes because of stress, but not as often, she only takes alprazolam  prn medication.    FMS: she feels better during Summer months, the cold weather makes symptoms worse. She states her body pain is about 0/10, she is doing better since she retired because she can rest more often during the day, average 2/10 , taking  Ibuprofen very seldom now. She states she has been walking for 20 minutes stops for 10 min because of left knee pain and walks 20 minutes more.  Very seldom takes Hydrocodone  Obesity: she states that since August 1st, she is cutting down on carbs and is feeling better, based on last visit she is losing weight.  She likes to eat food - not necessarily desserts. She states she over eats and feels guilty, she never tried laxatives or throwing up to help with weight loss.   Patient Active Problem List   Diagnosis Date Noted  . Hyperglycemia 07/09/2015  . Radiculitis of left cervical region 10/28/2014  . Vitamin D deficiency 10/26/2014  . Lumbosacral spondylosis without myelopathy 10/26/2014  . Extreme obesity ([place]) 10/26/2014  . De Quervain's disease (radial styloid tenosynovitis) 10/26/2014  . Nephrolithiasis 10/23/2014  . Trigger finger, right 10/23/2014  . Carpal tunnel syndrome 10/23/2014  . Generalized anxiety disorder 10/23/2014  . Major depression  ([place]) 10/23/2014  . Fibromyalgia 10/23/2014  . Perennial allergic rhinitis 10/23/2014  . Dyslipidemia 10/23/2014  . Benign hypertension 10/23/2014  . Morbid drug-induced obesity 10/23/2014  . Insomnia 10/23/2014  . Metabolic syndrome 99991111  . Tobacco use disorder 10/23/2014  . Menopause syndrome 10/23/2014  . Lumbago 10/23/2014  . Osteoarthritis 10/23/2014  . Atrophic vaginitis 10/23/2014    No past surgical history on file.  No family history on file.  Social History   Social History  . Marital status: Married    Spouse name: N/A  . Number of children: N/A  . Years of education: N/A   Occupational History  . Not on file.   Social History Main Topics  . Smoking status: Light Tobacco Smoker  . Smokeless tobacco: Never Used  . Alcohol use 0.0 oz/week  . Drug use: No  . Sexual activity: Yes   Other Topics Concern  . Not on file   Social History Narrative  . No narrative on file     Current Outpatient Prescriptions:  .  ALPRAZolam (XANAX) 0.5 MG tablet, Take 1 tablet (0.5 mg total) by mouth as needed for anxiety., Disp: 60 tablet, Rfl: 0 .  Calcium Citrate-Vitamin D (CALCIUM + D PO), Take by mouth., Disp: , Rfl:  .  Cholecalciferol (VITAMIN D) 2000 UNITS CAPS, Take 1 tablet by mouth daily., Disp: , Rfl:  .  escitalopram (LEXAPRO) 20 MG tablet, TAKE 1  TABLET (20 MG TOTAL) BY MOUTH DAILY., Disp: 90 tablet, Rfl: 1 .  HYDROcodone-acetaminophen (NORCO/VICODIN) 5-325 MG tablet, Take 1 tablet by mouth every 6 (six) hours as needed for severe pain., Disp: 20 tablet, Rfl: 0 .  ibuprofen (ADVIL,MOTRIN) 800 MG tablet, Take 1 tablet by mouth as needed. Reported on 05/09/2015, Disp: , Rfl:  .  levocetirizine (XYZAL) 5 MG tablet, TAKE 1 TABLET (5 MG TOTAL) BY MOUTH DAILY., Disp: 90 tablet, Rfl: 1 .  pravastatin (PRAVACHOL) 40 MG tablet, Take 1 tablet (40 mg total) by mouth every evening., Disp: 90 tablet, Rfl: 1 .  valsartan-hydrochlorothiazide (DIOVAN-HCT) 320-25 MG tablet,  TAKE 1 TABLET BY MOUTH DAILY., Disp: 90 tablet, Rfl: 1 .  vitamin E 100 UNIT capsule, Take by mouth daily., Disp: , Rfl:   No Known Allergies   ROS  Constitutional: Negative for fever or weight change.  Respiratory: Negative for cough and shortness of breath.   Cardiovascular: Negative for chest pain or palpitations.  Gastrointestinal: Negative for abdominal pain, no bowel changes.  Musculoskeletal: Negative for gait problem or joint swelling.  Skin: Negative for rash.  Neurological: Negative for dizziness or headache.  No other specific complaints in a complete review of systems (except as listed in HPI above).  Objective  Vitals:   11/10/15 1110  BP: 118/74  Pulse: 100  Resp: 16  Temp: 98.3 F (36.8 C)  SpO2: 95%  Weight: 244 lb 3 oz (110.8 kg)  Height: 5\' 3"  (1.6 m)    Body mass index is 43.26 kg/m.  Physical Exam  Constitutional: Patient appears well-developed and well-nourished. Obese No distress.  HEENT: head atraumatic, normocephalic, pupils equal and reactive to light,  neck supple, throat within normal limits Cardiovascular: Normal rate, regular rhythm and normal heart sounds.  No murmur heard. No BLE edema. Pulmonary/Chest: Effort normal and breath sounds normal. No respiratory distress. Abdominal: Soft.  There is no tenderness. Psychiatric: Patient has a normal mood and affect. behavior is normal. Judgment and thought content normal. Muscular Skeletal: trigger point positive  PHQ2/9: Depression screen [facility] 2/9 11/10/2015 05/09/2015 10/28/2014  Decreased Interest 0 0 0  Down, Depressed, Hopeless 0 1 0  PHQ - 2 Score 0 1 0     Fall Risk: Fall Risk  11/10/2015 05/09/2015 10/28/2014  Falls in the past year? No No Yes  Number falls in past yr: - - 1  Injury with Fall? - - Yes     Functional Status Survey: Is the patient deaf or have difficulty hearing?: No Does the patient have difficulty seeing, even when wearing glasses/contacts?: No Does the patient  have difficulty concentrating, remembering, or making decisions?: No Does the patient have difficulty walking or climbing stairs?: No Does the patient have difficulty dressing or bathing?: No Does the patient have difficulty doing errands alone such as visiting a doctor's office or shopping?: No    Assessment & Plan  1. Generalized anxiety disorder  Continue prn medication   2. Benign hypertension  Well controlled, continue medication   3. Metabolic syndrome  She is on a low carbohydrate diet  4. Dyslipidemia  - pravastatin (PRAVACHOL) 40 MG tablet; Take 1 tablet (40 mg total) by mouth every evening.  Dispense: 90 tablet; Refill: 1  5. Needs flu shot  - Flu vaccine HIGH DOSE PF  6. Insomnia  Doing well now, sleeping about 7 hours per night  7. Fibromyalgia  Doing well at this time  8. Morbid obesity, unspecified obesity type [facility])  Discussed  with the patient the risk posed by an increased BMI. Discussed importance of portion control, calorie counting and at least 150 minutes of physical activity weekly. Avoid sweet beverages and drink more water. Eat at least 6 servings of fruit and vegetables daily   9. Binge eating disorder  - lisdexamfetamine (VYVANSE) 20 MG capsule; Take 1 capsule (20 mg total) by mouth daily. Start with one first week and after that 2 in am  Dispense: 60 capsule; Refill: 0

## 2015-12-23 ENCOUNTER — Ambulatory Visit: Payer: Federal, State, Local not specified - PPO | Admitting: Family Medicine

## 2016-04-25 ENCOUNTER — Other Ambulatory Visit: Payer: Self-pay | Admitting: Family Medicine

## 2016-04-25 DIAGNOSIS — F411 Generalized anxiety disorder: Secondary | ICD-10-CM

## 2016-04-25 DIAGNOSIS — J3089 Other allergic rhinitis: Secondary | ICD-10-CM

## 2016-04-25 DIAGNOSIS — I10 Essential (primary) hypertension: Secondary | ICD-10-CM

## 2016-05-07 ENCOUNTER — Ambulatory Visit: Payer: Federal, State, Local not specified - PPO | Admitting: Family Medicine

## 2016-05-11 ENCOUNTER — Ambulatory Visit: Payer: Federal, State, Local not specified - PPO | Admitting: Family Medicine

## 2016-06-07 ENCOUNTER — Ambulatory Visit (INDEPENDENT_AMBULATORY_CARE_PROVIDER_SITE_OTHER): Payer: Federal, State, Local not specified - PPO | Admitting: Family Medicine

## 2016-06-07 ENCOUNTER — Encounter: Payer: Self-pay | Admitting: Family Medicine

## 2016-06-07 VITALS — BP 110/64 | HR 94 | Temp 98.2°F | Resp 16 | Ht 63.0 in | Wt 252.2 lb

## 2016-06-07 DIAGNOSIS — R739 Hyperglycemia, unspecified: Secondary | ICD-10-CM | POA: Diagnosis not present

## 2016-06-07 DIAGNOSIS — F5081 Binge eating disorder: Secondary | ICD-10-CM

## 2016-06-07 DIAGNOSIS — M25562 Pain in left knee: Secondary | ICD-10-CM

## 2016-06-07 DIAGNOSIS — Z23 Encounter for immunization: Secondary | ICD-10-CM | POA: Diagnosis not present

## 2016-06-07 DIAGNOSIS — I10 Essential (primary) hypertension: Secondary | ICD-10-CM | POA: Diagnosis not present

## 2016-06-07 DIAGNOSIS — F411 Generalized anxiety disorder: Secondary | ICD-10-CM

## 2016-06-07 DIAGNOSIS — R062 Wheezing: Secondary | ICD-10-CM

## 2016-06-07 DIAGNOSIS — E785 Hyperlipidemia, unspecified: Secondary | ICD-10-CM | POA: Diagnosis not present

## 2016-06-07 DIAGNOSIS — G8929 Other chronic pain: Secondary | ICD-10-CM

## 2016-06-07 DIAGNOSIS — J3089 Other allergic rhinitis: Secondary | ICD-10-CM

## 2016-06-07 DIAGNOSIS — M797 Fibromyalgia: Secondary | ICD-10-CM

## 2016-06-07 DIAGNOSIS — E8881 Metabolic syndrome: Secondary | ICD-10-CM

## 2016-06-07 MED ORDER — PRAVASTATIN SODIUM 40 MG PO TABS: 40.0000 mg | ORAL_TABLET | Freq: Every evening | ORAL | 1 refills | Status: DC

## 2016-06-07 MED ORDER — LEVOCETIRIZINE DIHYDROCHLORIDE 5 MG PO TABS: 5.0000 mg | ORAL_TABLET | Freq: Every day | ORAL | 0 refills | Status: DC

## 2016-06-07 MED ORDER — LISDEXAMFETAMINE DIMESYLATE 30 MG PO CAPS: 30.0000 mg | ORAL_CAPSULE | Freq: Every day | ORAL | 0 refills | Status: DC

## 2016-06-07 NOTE — Progress Notes (Signed)
Name: Veronica Mendez   MRN: [id number]    DOB: 1951-01-24   Date:06/07/2016       Progress Note  Subjective  Chief Complaint  Chief Complaint  Patient presents with  . Anxiety    6 month follow up    HPI  HTN: bp is at goal, taking Valsartan HCTZ and bp is at goal, she denies chest pain or palpitation   Hyperlipidemia: taking Pravastatin daily now, and last LDL was at goal, we will recheck levels.  Depression/GAD: she states depression is under control, she retired now, and is feeling well, still worries all the time, she is an anxious person. Sometimes she smokes because of stress, but not as often, she only takes alprazolam  prn medication. Last rx was given over one year ago. She states down to 3 cigarettes a day but is eating more  FMS: she feels better during Summer months, the cold weather makes symptoms worse. She states her body pain is about 1/10, she is doing better since she retired because she can rest more often during the day, average 2/10 , taking  Ibuprofen very seldom now. She states she has been walking for 20 minutes stops for 10 min because of left knee pain and walks 20 minutes more.  Advised Tylenol for knee pain, she does not want to take Lyrica or Gabapentin.   Obesity: We gave her a rx of Vyvanse back in 10/2015 for binge eating disorder, but she was afraid to fill it. She continues to have problems not over eating and feels guilty. She is willing to try it now.   She likes to eat food - not necessarily desserts. She never tried laxatives or throwing up to help with weight loss.  Gained 8 lbs since last visit  Metabolic Syndrome: she has polyphagia, no polydipsia or polyuria  Patient Active Problem List   Diagnosis Date Noted  . Binge eating disorder 11/10/2015  . Hyperglycemia 07/09/2015  . Radiculitis of left cervical region 10/28/2014  . Vitamin D deficiency 10/26/2014  . Lumbosacral spondylosis without myelopathy 10/26/2014  . Extreme obesity  10/26/2014  . De Quervain's disease (radial styloid tenosynovitis) 10/26/2014  . Nephrolithiasis 10/23/2014  . Trigger finger, right 10/23/2014  . Carpal tunnel syndrome 10/23/2014  . Generalized anxiety disorder 10/23/2014  . Major depression ([place]) 10/23/2014  . Fibromyalgia 10/23/2014  . Perennial allergic rhinitis 10/23/2014  . Dyslipidemia 10/23/2014  . Benign hypertension 10/23/2014  . Morbid drug-induced obesity 10/23/2014  . Insomnia 10/23/2014  . Metabolic syndrome 76/28/3151  . Tobacco use disorder 10/23/2014  . Menopause syndrome 10/23/2014  . Lumbago 10/23/2014  . Osteoarthritis 10/23/2014  . Atrophic vaginitis 10/23/2014    Past Surgical History:  Procedure Laterality Date  . ABDOMINAL HYSTERECTOMY Bilateral 1988  . KNEE SURGERY Left 2014    Family History  Problem Relation Age of Onset  . Osteoarthritis Mother   . Stroke Mother   . Heart disease Father   . Diabetes Brother   . Diabetes Maternal Grandfather     Social History   Social History  . Marital status: Married    Spouse name: N/A  . Number of children: N/A  . Years of education: N/A   Occupational History  . Not on file.   Social History Main Topics  . Smoking status: Light Tobacco Smoker  . Smokeless tobacco: Never Used  . Alcohol use 0.0 oz/week  . Drug use: No  . Sexual activity: Yes   Other Topics Concern  .  Not on file   Social History Narrative  . No narrative on file     Current Outpatient Prescriptions:  .  ALPRAZolam (XANAX) 0.5 MG tablet, Take 1 tablet (0.5 mg total) by mouth as needed for anxiety., Disp: 60 tablet, Rfl: 0 .  Calcium Citrate-Vitamin D (CALCIUM + D PO), Take by mouth., Disp: , Rfl:  .  Cholecalciferol (VITAMIN D) 2000 UNITS CAPS, Take 1 tablet by mouth daily., Disp: , Rfl:  .  escitalopram (LEXAPRO) 20 MG tablet, TAKE 1 TABLET (20 MG TOTAL) BY MOUTH DAILY., Disp: 90 tablet, Rfl: 1 .  levocetirizine (XYZAL) 5 MG tablet, Take 1 tablet (5 mg total) by mouth  daily., Disp: 90 tablet, Rfl: 0 .  lisdexamfetamine (VYVANSE) 30 MG capsule, Take 1 capsule (30 mg total) by mouth daily. Start with one first week and after that 2 in am, Disp: 30 capsule, Rfl: 0 .  pravastatin (PRAVACHOL) 40 MG tablet, Take 1 tablet (40 mg total) by mouth every evening., Disp: 90 tablet, Rfl: 1 .  valsartan-hydrochlorothiazide (DIOVAN-HCT) 320-25 MG tablet, TAKE 1 TABLET BY MOUTH DAILY., Disp: 90 tablet, Rfl: 1 .  vitamin E 100 UNIT capsule, Take by mouth daily., Disp: , Rfl:   No Known Allergies   ROS  Constitutional: Negative for fever, positive for weight change.  Respiratory: Positive for intermittent cough no shortness of breath.   Cardiovascular: Negative for chest pain or palpitations.  Gastrointestinal: Negative for abdominal pain, no bowel changes.  Musculoskeletal: Positive  for gait problem and right knee joint swelling.  Skin: Negative for rash.  Neurological: Negative for dizziness or headache.  No other specific complaints in a complete review of systems (except as listed in HPI above).  Objective  Vitals:   06/07/16 1151  BP: 110/64  Pulse: 94  Resp: 16  Temp: 98.2 F (36.8 C)  SpO2: 96%  Weight: 252 lb 3 oz (114.4 kg)  Height: 5\' 3"  (1.6 m)    Body mass index is 44.67 kg/m.  Physical Exam  Constitutional: Patient appears well-developed and well-nourished. Obese  No distress.  HEENT: head atraumatic, normocephalic, pupils equal and reactive to light,  neck supple, throat within normal limits Cardiovascular: Normal rate, regular rhythm and normal heart sounds.  No murmur heard. No BLE edema. Pulmonary/Chest: Effort normal and breath sounds showed anterior wheezing. No respiratory distress. Abdominal: Soft.  There is no tenderness. Psychiatric: Patient has a normal mood and affect. behavior is normal. Judgment and thought content normal. Muscular Skeletal: trigger points positive   PHQ2/9: Depression screen [facility] 2/9 11/10/2015 05/09/2015  10/28/2014  Decreased Interest 0 0 0  Down, Depressed, Hopeless 0 1 0  PHQ - 2 Score 0 1 0     Fall Risk: Fall Risk  11/10/2015 05/09/2015 10/28/2014  Falls in the past year? No No Yes  Number falls in past yr: - - 1  Injury with Fall? - - Yes      Assessment & Plan  1. Benign hypertension  - COMPLETE METABOLIC PANEL WITH GFR - CBC with Differential/Platelet  2. Generalized anxiety disorder  Continue Lexapro, takes alprazolam very seldom  3. Metabolic syndrome  - Hemoglobin A1c - Insulin, fasting  4. Dyslipidemia  - pravastatin (PRAVACHOL) 40 MG tablet; Take 1 tablet (40 mg total) by mouth every evening.  Dispense: 90 tablet; Refill: 1 - Lipid panel  5. Fibromyalgia  stable  6. Binge eating disorder  - lisdexamfetamine (VYVANSE) 30 MG capsule; Take 1 capsule (30 mg total) by  mouth daily. Start with one first week and after that 2 in am  Dispense: 30 capsule; Refill: 0  7. Morbid obesity, unspecified obesity type ([place])  - Insulin, fasting  8. Perennial allergic rhinitis  - levocetirizine (XYZAL) 5 MG tablet; Take 1 tablet (5 mg total) by mouth daily.  Dispense: 90 tablet; Refill: 0  9. Hyperglycemia  - Hemoglobin A1c  10. Need for vaccination for pneumococcus  - Pneumococcal polysaccharide vaccine 23-valent greater than or equal to 2yo subcutaneous/IM   11. Chronic pain of left knee  She had meniscectomy, she has OA, advised to hold off on nsaid's and take Tylenol prn

## 2016-07-05 ENCOUNTER — Other Ambulatory Visit: Payer: Self-pay | Admitting: Family Medicine

## 2016-07-06 LAB — CBC WITH DIFFERENTIAL/PLATELET
Basophils Absolute: 0.1 10*3/uL (ref 0.0–0.2)
Basos: 1 %
EOS (ABSOLUTE): 0.2 10*3/uL (ref 0.0–0.4)
EOS: 4 %
HEMATOCRIT: 45.3 % (ref 34.0–46.6)
HEMOGLOBIN: 15.4 g/dL (ref 11.1–15.9)
IMMATURE GRANS (ABS): 0 10*3/uL (ref 0.0–0.1)
Immature Granulocytes: 0 %
LYMPHS ABS: 2.1 10*3/uL (ref 0.7–3.1)
Lymphs: 33 %
MCH: 31 pg (ref 26.6–33.0)
MCHC: 34 g/dL (ref 31.5–35.7)
MCV: 91 fL (ref 79–97)
MONOCYTES: 10 %
Monocytes Absolute: 0.6 10*3/uL (ref 0.1–0.9)
NEUTROS ABS: 3.5 10*3/uL (ref 1.4–7.0)
Neutrophils: 52 %
Platelets: 319 10*3/uL (ref 150–379)
RBC: 4.96 x10E6/uL (ref 3.77–5.28)
RDW: 12.9 % (ref 12.3–15.4)
WBC: 6.5 10*3/uL (ref 3.4–10.8)

## 2016-07-06 LAB — COMPREHENSIVE METABOLIC PANEL
ALBUMIN: 4.5 g/dL (ref 3.6–4.8)
ALK PHOS: 92 IU/L (ref 39–117)
ALT: 20 IU/L (ref 0–32)
AST: 18 IU/L (ref 0–40)
Albumin/Globulin Ratio: 2 (ref 1.2–2.2)
BUN / CREAT RATIO: 28 (ref 12–28)
BUN: 18 mg/dL (ref 8–27)
Bilirubin Total: 0.4 mg/dL (ref 0.0–1.2)
CO2: 24 mmol/L (ref 18–29)
CREATININE: 0.65 mg/dL (ref 0.57–1.00)
Calcium: 9.7 mg/dL (ref 8.7–10.3)
Chloride: 103 mmol/L (ref 96–106)
GFR calc non Af Amer: 93 mL/min/{1.73_m2} (ref >59)
GFR, EST AFRICAN AMERICAN: 107 mL/min/{1.73_m2} (ref >59)
GLOBULIN, TOTAL: 2.2 g/dL (ref 1.5–4.5)
Glucose: 96 mg/dL (ref 65–99)
Potassium: 5 mmol/L (ref 3.5–5.2)
SODIUM: 141 mmol/L (ref 134–144)
TOTAL PROTEIN: 6.7 g/dL (ref 6.0–8.5)

## 2016-07-06 LAB — LIPID PANEL W/O CHOL/HDL RATIO
CHOLESTEROL TOTAL: 198 mg/dL (ref 100–199)
HDL: 50 mg/dL (ref >39)
LDL CALC: 118 mg/dL — AB (ref 0–99)
Triglycerides: 149 mg/dL (ref 0–149)
VLDL CHOLESTEROL CAL: 30 mg/dL (ref 5–40)

## 2016-07-06 LAB — HGB A1C W/O EAG: HEMOGLOBIN A1C: 5.6 % (ref 4.8–5.6)

## 2016-07-06 LAB — INSULIN, RANDOM: INSULIN: 14.6 u[IU]/mL (ref 2.6–24.9)

## 2016-07-07 ENCOUNTER — Ambulatory Visit (INDEPENDENT_AMBULATORY_CARE_PROVIDER_SITE_OTHER): Payer: Federal, State, Local not specified - PPO | Admitting: Family Medicine

## 2016-07-07 ENCOUNTER — Encounter: Payer: Self-pay | Admitting: Family Medicine

## 2016-07-07 DIAGNOSIS — R062 Wheezing: Secondary | ICD-10-CM

## 2016-07-07 DIAGNOSIS — Z720 Tobacco use: Secondary | ICD-10-CM

## 2016-07-07 DIAGNOSIS — F5081 Binge eating disorder: Secondary | ICD-10-CM | POA: Diagnosis not present

## 2016-07-07 DIAGNOSIS — E8881 Metabolic syndrome: Secondary | ICD-10-CM

## 2016-07-07 MED ORDER — LISDEXAMFETAMINE DIMESYLATE 30 MG PO CAPS: 30.0000 mg | ORAL_CAPSULE | Freq: Every day | ORAL | 0 refills | Status: DC

## 2016-07-07 NOTE — Progress Notes (Signed)
Name: Veronica Mendez   MRN: [id number]    DOB: 13-Jul-1950   Date:07/07/2016       Progress Note  Subjective  Chief Complaint  Chief Complaint  Patient presents with  . Binge Eating Disorder    Started Vyvanse last visit, doing well with medication-has lost 5 pounds since last visit.  . Wheezing    Would like X-ray instead of breathing test    HPI   Obesity: We gave her a rx of Vyvanse back in 10/2015 for binge eating disorder, but she was afraid to fill it. She continues to have problems not over eating and feels guilty. She started on April 23 rd, 2018 on a new rx and is doing well, she has lost 5 lbs in the past month, does not have her life revolved around food/eating. She also has more motivation and is able to move more.   Metabolic Syndrome: she has polyphagia, no polydipsia or polyuria, elevated fasting insulin, hgbA1C has improved  History of tobacco use: she is still smoking, down to cigarettes daily , she has productive cough occasionally and wheezing, she prefers not having spirometry done, but is concerned about lung cancer, we will try low dose CT   Patient Active Problem List   Diagnosis Date Noted  . Binge eating disorder 11/10/2015  . Hyperglycemia 07/09/2015  . Radiculitis of left cervical region 10/28/2014  . Vitamin D deficiency 10/26/2014  . Lumbosacral spondylosis without myelopathy 10/26/2014  . Extreme obesity 10/26/2014  . De Quervain's disease (radial styloid tenosynovitis) 10/26/2014  . Nephrolithiasis 10/23/2014  . Trigger finger, right 10/23/2014  . Carpal tunnel syndrome 10/23/2014  . Generalized anxiety disorder 10/23/2014  . Major depression ([place]) 10/23/2014  . Fibromyalgia 10/23/2014  . Perennial allergic rhinitis 10/23/2014  . Dyslipidemia 10/23/2014  . Benign hypertension 10/23/2014  . Morbid drug-induced obesity 10/23/2014  . Insomnia 10/23/2014  . Metabolic syndrome 86/76/1950  . Tobacco use disorder 10/23/2014  . Menopause syndrome  10/23/2014  . Lumbago 10/23/2014  . Osteoarthritis 10/23/2014  . Atrophic vaginitis 10/23/2014    Past Surgical History:  Procedure Laterality Date  . ABDOMINAL HYSTERECTOMY Bilateral 1988  . KNEE SURGERY Left 2014    Family History  Problem Relation Age of Onset  . Osteoarthritis Mother   . Stroke Mother   . Heart disease Father   . Diabetes Brother   . Diabetes Maternal Grandfather     Social History   Social History  . Marital status: Married    Spouse name: N/A  . Number of children: N/A  . Years of education: N/A   Occupational History  . Not on file.   Social History Main Topics  . Smoking status: Current Every Day Smoker    Packs/day: 1.00    Years: 50.00    Types: Cigarettes  . Smokeless tobacco: Never Used     Comment: down to a few cigarettes now  . Alcohol use 0.0 oz/week  . Drug use: No  . Sexual activity: Yes   Other Topics Concern  . Not on file   Social History Narrative  . No narrative on file     Current Outpatient Prescriptions:  .  ALPRAZolam (XANAX) 0.5 MG tablet, Take 1 tablet (0.5 mg total) by mouth as needed for anxiety., Disp: 60 tablet, Rfl: 0 .  Calcium Citrate-Vitamin D (CALCIUM + D PO), Take by mouth., Disp: , Rfl:  .  Cholecalciferol (VITAMIN D) 2000 UNITS CAPS, Take 1 tablet by mouth daily., Disp: ,  Rfl:  .  escitalopram (LEXAPRO) 20 MG tablet, TAKE 1 TABLET (20 MG TOTAL) BY MOUTH DAILY., Disp: 90 tablet, Rfl: 1 .  levocetirizine (XYZAL) 5 MG tablet, Take 1 tablet (5 mg total) by mouth daily., Disp: 90 tablet, Rfl: 0 .  lisdexamfetamine (VYVANSE) 30 MG capsule, Take 1 capsule (30 mg total) by mouth daily. Start with one first week and after that 2 in am, Disp: 30 capsule, Rfl: 0 .  pravastatin (PRAVACHOL) 40 MG tablet, Take 1 tablet (40 mg total) by mouth every evening., Disp: 90 tablet, Rfl: 1 .  valsartan-hydrochlorothiazide (DIOVAN-HCT) 320-25 MG tablet, TAKE 1 TABLET BY MOUTH DAILY., Disp: 90 tablet, Rfl: 1 .  vitamin E 100  UNIT capsule, Take by mouth daily., Disp: , Rfl:  .  lisdexamfetamine (VYVANSE) 30 MG capsule, Take 1 capsule (30 mg total) by mouth daily., Disp: 30 capsule, Rfl: 0  No Known Allergies   ROS  Constitutional: Negative for fever, positive  weight change.  Respiratory: Positive  For intermittent  cough no recent  shortness of breath, she has occasional wheezing.   Cardiovascular: Negative for chest pain or palpitations.  Gastrointestinal: Negative for abdominal pain, no bowel changes.  Musculoskeletal: Negative for gait problem or joint swelling.  Skin: Negative for rash.  Neurological: Negative for dizziness or headache.  No other specific complaints in a complete review of systems (except as listed in HPI above).  Objective  Vitals:   07/07/16 1316  BP: 124/64  Pulse: 88  Resp: 16  Temp: 98 F (36.7 C)  TempSrc: Oral  SpO2: 98%  Weight: 247 lb (112 kg)  Height: 5\' 3"  (1.6 m)    Body mass index is 43.75 kg/m.  Physical Exam  Constitutional: Patient appears well-developed and well-nourished. Obese  No distress.  HEENT: head atraumatic, normocephalic, pupils equal and reactive to light, neck supple, throat within normal limits Cardiovascular: Normal rate, regular rhythm and normal heart sounds.  No murmur heard. No BLE edema. Pulmonary/Chest: Effort normal and breath sounds normal. No respiratory distress. Abdominal: Soft.  There is no tenderness. Psychiatric: Patient has a normal mood and affect. behavior is normal. Judgment and thought content normal.  Recent Results (from the past 2160 hour(s))  CBC with Differential/Platelet     Status: None   Collection Time: 07/05/16 11:14 AM  Result Value Ref Range   WBC 6.5 3.4 - 10.8 x10E3/uL   RBC 4.96 3.77 - 5.28 x10E6/uL   Hemoglobin 15.4 11.1 - 15.9 g/dL   Hematocrit 45.3 34.0 - 46.6 %   MCV 91 79 - 97 fL   MCH 31.0 26.6 - 33.0 pg   MCHC 34.0 31.5 - 35.7 g/dL   RDW 12.9 12.3 - 15.4 %   Platelets 319 150 - 379 x10E3/uL    Neutrophils 52 Not Estab. %   Lymphs 33 Not Estab. %   Monocytes 10 Not Estab. %   Eos 4 Not Estab. %   Basos 1 Not Estab. %   Neutrophils Absolute 3.5 1.4 - 7.0 x10E3/uL   Lymphocytes Absolute 2.1 0.7 - 3.1 x10E3/uL   Monocytes Absolute 0.6 0.1 - 0.9 x10E3/uL   EOS (ABSOLUTE) 0.2 0.0 - 0.4 x10E3/uL   Basophils Absolute 0.1 0.0 - 0.2 x10E3/uL   Immature Granulocytes 0 Not Estab. %   Immature Grans (Abs) 0.0 0.0 - 0.1 x10E3/uL  Comprehensive metabolic panel     Status: None   Collection Time: 07/05/16 11:14 AM  Result Value Ref Range   Glucose 96  65 - 99 mg/dL   BUN 18 8 - 27 mg/dL   Creatinine, Ser 0.65 0.57 - 1.00 mg/dL   GFR calc non Af Amer 93 >59 mL/min/1.73   GFR calc Af Amer 107 >59 mL/min/1.73   BUN/Creatinine Ratio 28 12 - 28   Sodium 141 134 - 144 mmol/L   Potassium 5.0 3.5 - 5.2 mmol/L   Chloride 103 96 - 106 mmol/L   CO2 24 18 - 29 mmol/L   Calcium 9.7 8.7 - 10.3 mg/dL   Total Protein 6.7 6.0 - 8.5 g/dL   Albumin 4.5 3.6 - 4.8 g/dL   Globulin, Total 2.2 1.5 - 4.5 g/dL   Albumin/Globulin Ratio 2.0 1.2 - 2.2   Bilirubin Total 0.4 0.0 - 1.2 mg/dL   Alkaline Phosphatase 92 39 - 117 IU/L   AST 18 0 - 40 IU/L   ALT 20 0 - 32 IU/L  Lipid Panel w/o Chol/HDL Ratio     Status: Abnormal   Collection Time: 07/05/16 11:14 AM  Result Value Ref Range   Cholesterol, Total 198 100 - 199 mg/dL   Triglycerides 149 0 - 149 mg/dL   HDL 50 >39 mg/dL   VLDL Cholesterol Cal 30 5 - 40 mg/dL   LDL Calculated 118 (H) 0 - 99 mg/dL  Hgb A1c w/o eAG     Status: None   Collection Time: 07/05/16 11:14 AM  Result Value Ref Range   Hgb A1c MFr Bld 5.6 4.8 - 5.6 %    Comment:          Pre-diabetes: 5.7 - 6.4          Diabetes: >6.4          Glycemic control for adults with diabetes: <7.0   Insulin, random     Status: None   Collection Time: 07/05/16 11:14 AM  Result Value Ref Range   INSULIN 14.6 2.6 - 24.9 uIU/mL    PHQ2/9: Depression screen [facility] 2/9 07/07/2016 11/10/2015 05/09/2015  10/28/2014  Decreased Interest 0 0 0 0  Down, Depressed, Hopeless 0 0 1 0  PHQ - 2 Score 0 0 1 0     Fall Risk: Fall Risk  07/07/2016 11/10/2015 05/09/2015 10/28/2014  Falls in the past year? No No No Yes  Number falls in past yr: - - - 1  Injury with Fall? - - - Yes     Functional Status Survey: Is the patient deaf or have difficulty hearing?: No Does the patient have difficulty seeing, even when wearing glasses/contacts?: No Does the patient have difficulty concentrating, remembering, or making decisions?: No Does the patient have difficulty walking or climbing stairs?: No Does the patient have difficulty dressing or bathing?: No Does the patient have difficulty doing errands alone such as visiting a doctor's office or shopping?: No    Assessment & Plan  1. Morbid obesity, unspecified obesity type ([place])  She lost 5 lbs since last visit  GLP-1 agonist  2. Metabolic syndrome  She is doing well, hgbA1C has improved, she had elevated insulin level   3. Wheezing  She states it is occasional she wants to hold off on spirometry, explained that CXR is not indicated for screen lung cancer, she wants to hold off on both at this time.   4. Binge eating disorder  She is doing well on Vyvanse, lost 5 lbs since last visit, not always focusing on eating, has noticed that she is moving more now - lisdexamfetamine (VYVANSE) 30 MG capsule;  Take 1 capsule (30 mg total) by mouth daily. Start with one first week and after that 2 in am  Dispense: 30 capsule; Refill: 0 - lisdexamfetamine (VYVANSE) 30 MG capsule; Take 1 capsule (30 mg total) by mouth daily.  Dispense: 30 capsule; Refill: 0   5. Tobacco use  - CT CHEST LUNG CA SCREEN LOW DOSE W/O CM; Future

## 2016-07-14 ENCOUNTER — Telehealth: Payer: Self-pay | Admitting: *Deleted

## 2016-07-14 DIAGNOSIS — Z87891 Personal history of nicotine dependence: Secondary | ICD-10-CM

## 2016-07-14 NOTE — Telephone Encounter (Signed)
Received referral for initial lung cancer screening scan. Contacted patient and obtained smoking history,(current, 50 pack year) as well as answering questions related to screening process. Patient denies signs of lung cancer such as weight loss or hemoptysis. Patient denies comorbidity that would prevent curative treatment if lung cancer were found. Patient is scheduled for shared decision making visit and CT scan on 07/28/16.

## 2016-07-28 ENCOUNTER — Ambulatory Visit
Admission: RE | Admit: 2016-07-28 | Discharge: 2016-07-28 | Disposition: A | Discharge status: Home / Self Care | Payer: Federal, State, Local not specified - PPO | Source: Ambulatory Visit | Attending: Oncology | Admitting: Oncology

## 2016-07-28 ENCOUNTER — Inpatient Hospital Stay: Payer: Federal, State, Local not specified - PPO | Attending: Oncology | Admitting: Oncology

## 2016-07-28 DIAGNOSIS — Z122 Encounter for screening for malignant neoplasm of respiratory organs: Secondary | ICD-10-CM | POA: Diagnosis present

## 2016-07-28 DIAGNOSIS — R918 Other nonspecific abnormal finding of lung field: Secondary | ICD-10-CM | POA: Diagnosis not present

## 2016-07-28 DIAGNOSIS — J439 Emphysema, unspecified: Secondary | ICD-10-CM | POA: Insufficient documentation

## 2016-07-28 DIAGNOSIS — I700 Atherosclerosis of aorta: Secondary | ICD-10-CM | POA: Insufficient documentation

## 2016-07-28 DIAGNOSIS — F17210 Nicotine dependence, cigarettes, uncomplicated: Secondary | ICD-10-CM

## 2016-07-28 DIAGNOSIS — Z87891 Personal history of nicotine dependence: Secondary | ICD-10-CM

## 2016-07-30 ENCOUNTER — Telehealth: Payer: Self-pay | Admitting: *Deleted

## 2016-07-30 ENCOUNTER — Encounter: Payer: Self-pay | Admitting: Family Medicine

## 2016-07-30 DIAGNOSIS — R911 Solitary pulmonary nodule: Secondary | ICD-10-CM | POA: Insufficient documentation

## 2016-07-30 DIAGNOSIS — J432 Centrilobular emphysema: Secondary | ICD-10-CM | POA: Insufficient documentation

## 2016-07-30 DIAGNOSIS — Z87891 Personal history of nicotine dependence: Secondary | ICD-10-CM | POA: Insufficient documentation

## 2016-07-30 DIAGNOSIS — I700 Atherosclerosis of aorta: Secondary | ICD-10-CM | POA: Insufficient documentation

## 2016-07-30 NOTE — Progress Notes (Signed)
In accordance with CMS guidelines, patient has met eligibility criteria including age, absence of signs or symptoms of lung cancer.  Social History  Substance Use Topics  . Smoking status: Current Every Day Smoker    Packs/day: 1.00    Years: 50.00    Types: Cigarettes  . Smokeless tobacco: Never Used     Comment: down to a few cigarettes now  . Alcohol use 0.0 oz/week     A shared decision-making session was conducted prior to the performance of CT scan. This includes one or more decision aids, includes benefits and harms of screening, follow-up diagnostic testing, over-diagnosis, false positive rate, and total radiation exposure.  Counseling on the importance of adherence to annual lung cancer LDCT screening, impact of co-morbidities, and ability or willingness to undergo diagnosis and treatment is imperative for compliance of the program.  Counseling on the importance of continued smoking cessation for former smokers; the importance of smoking cessation for current smokers, and information about tobacco cessation interventions have been given to patient including [place] and [facility] quit [place] programs.  Written order for lung cancer screening with LDCT has been given to the patient and any and all questions have been answered to the best of my abilities.   Yearly follow up will be coordinated by Burgess Estelle, Thoracic Navigator.

## 2016-07-30 NOTE — Telephone Encounter (Signed)
Notified patient of LDCT lung cancer screening program results with recommendation for 6 month follow up imaging. Also notified of incidental findings noted below and is encouraged to discuss further with PCP who will receive a copy of this note and/or the CT report. Patient verbalizes understanding.   IMPRESSION: 7.8 mm ovoid nodule in the right middle lobe, favored to reflect nodular scarring. Lung-RADS 3, probably benign findings. Short-term follow-up in 6 months is recommended with repeat low-dose chest CT without contrast (please use the following order, "CT CHEST LCS NODULE FOLLOW-UP W/O CM").  Aortic Atherosclerosis (ICD10-I70.0) and Emphysema (ICD10-J43.9).

## 2016-09-06 ENCOUNTER — Encounter: Payer: Self-pay | Admitting: Family Medicine

## 2016-09-06 ENCOUNTER — Ambulatory Visit (INDEPENDENT_AMBULATORY_CARE_PROVIDER_SITE_OTHER): Payer: Federal, State, Local not specified - PPO | Admitting: Family Medicine

## 2016-09-06 VITALS — BP 132/74 | HR 97 | Temp 98.1°F | Resp 16 | Ht 63.0 in | Wt 243.0 lb

## 2016-09-06 DIAGNOSIS — F5081 Binge eating disorder: Secondary | ICD-10-CM | POA: Diagnosis not present

## 2016-09-06 DIAGNOSIS — I10 Essential (primary) hypertension: Secondary | ICD-10-CM

## 2016-09-06 DIAGNOSIS — I2584 Coronary atherosclerosis due to calcified coronary lesion: Secondary | ICD-10-CM

## 2016-09-06 DIAGNOSIS — J432 Centrilobular emphysema: Secondary | ICD-10-CM | POA: Diagnosis not present

## 2016-09-06 DIAGNOSIS — F411 Generalized anxiety disorder: Secondary | ICD-10-CM | POA: Diagnosis not present

## 2016-09-06 DIAGNOSIS — I700 Atherosclerosis of aorta: Secondary | ICD-10-CM | POA: Diagnosis not present

## 2016-09-06 DIAGNOSIS — I2510 Atherosclerotic heart disease of native coronary artery without angina pectoris: Secondary | ICD-10-CM | POA: Diagnosis not present

## 2016-09-06 DIAGNOSIS — E8881 Metabolic syndrome: Secondary | ICD-10-CM

## 2016-09-06 MED ORDER — LISDEXAMFETAMINE DIMESYLATE 40 MG PO CAPS: 40.0000 mg | ORAL_CAPSULE | Freq: Every day | ORAL | 0 refills | Status: DC

## 2016-09-06 MED ORDER — ASPIRIN EC 81 MG PO TBEC: 81.0000 mg | DELAYED_RELEASE_TABLET | Freq: Every day | ORAL | 0 refills | Status: DC

## 2016-09-06 MED ORDER — ROSUVASTATIN CALCIUM 10 MG PO TABS: 10.0000 mg | ORAL_TABLET | Freq: Every day | ORAL | 1 refills | Status: DC

## 2016-09-06 MED ORDER — LISDEXAMFETAMINE DIMESYLATE 40 MG PO CAPS: 40.0000 mg | ORAL_CAPSULE | ORAL | 0 refills | Status: DC

## 2016-09-06 MED ORDER — OLMESARTAN MEDOXOMIL-HCTZ 40-25 MG PO TABS: 1.0000 | ORAL_TABLET | Freq: Every day | ORAL | 1 refills | Status: DC

## 2016-09-06 NOTE — Progress Notes (Signed)
Name: Veronica Mendez   MRN: [id number]    DOB: [birth year]   Date:09/06/2016       Progress Note  Subjective  Chief Complaint  Chief Complaint  Patient presents with  . Follow-up    2 month F/U  . Binge Eating Disorder  . Obesity  . Nicotine Dependence  . Hypertension  . Hyperlipidemia    HPI  HTN: bp is at goal, taking Valsartan HCTZ and bp is at goal, but because of Valsartan being recalled so we will change to Benicar HCTZ,  she denies chest pain or palpitation   Hyperlipidemia: taking Pravastatin daily now, and last LDL was at goal. She was found to have calcification of coronary vessels found on CT chest, we will change to high dose statin and she is going to start aspirin  Depression/GAD: she states depression is under control, she retired now, and is feeling well, still worries all the time, she is an anxious person. Sometimes she stopped smoking completely  she only takes alprazolam prn medication. Last rx was given over one year ago.   FMS: she feels better during Summer months, the cold weather makes symptoms worse. She states her body pain is about 1/10, she is doing better since she retired because she can rest more often during the day, average 2/10 , taking Ibuprofen very seldom now. She states she has been walking for 20 minutes stops for 10 min because of left knee pain and walks 20 minutes more. Advised Tylenol for knee pain, she does not want to take Lyrica or Gabapentin.   Obesity: We gave her a rx of Vyvanse back in 10/2015 for binge eating disorder, but she was afraid to fill it. She continues to have problems not over eating and feels guilty. She started medication April 2018. She likes to eat food - not necessarily desserts. She never tried laxatives or throwing up to help with weight loss.  Lost 9 lbs since started on Vyvanse 30 mg, no side effects, we will increase dose and monitor, doing well.   Metabolic Syndrome: she has polyphagia, no polydipsia or  polyuria. She had elevated fasting insulin.   Centrolobular emphysema: she has mild dry cough in am's, but no SOB or wheezing.   Atherosclerosis , aorta : discussed results from CT chest done, advised higher dose of crestor and start aspirin  Lung nodule: previous smoker, going back for repeat in 6 months.   Patient Active Problem List   Diagnosis Date Noted  . Coronary artery calcification 09/06/2016  . Lung nodule, solitary 07/30/2016  . Atherosclerosis of aorta ([place]) 07/30/2016  . Centrilobular emphysema ([place]) 07/30/2016  . Personal history of tobacco use, presenting hazards to health 07/30/2016  . Binge eating disorder 11/10/2015  . Hyperglycemia 07/09/2015  . Radiculitis of left cervical region 10/28/2014  . Vitamin D deficiency 10/26/2014  . Lumbosacral spondylosis without myelopathy 10/26/2014  . Extreme obesity 10/26/2014  . De Quervain's disease (radial styloid tenosynovitis) 10/26/2014  . Nephrolithiasis 10/23/2014  . Trigger finger, right 10/23/2014  . Carpal tunnel syndrome 10/23/2014  . Generalized anxiety disorder 10/23/2014  . Major depression ([place]) 10/23/2014  . Fibromyalgia 10/23/2014  . Perennial allergic rhinitis 10/23/2014  . Dyslipidemia 10/23/2014  . Benign hypertension 10/23/2014  . Morbid drug-induced obesity 10/23/2014  . Insomnia 10/23/2014  . Metabolic syndrome 28/78/6767  . Tobacco use disorder 10/23/2014  . Menopause syndrome 10/23/2014  . Lumbago 10/23/2014  . Osteoarthritis 10/23/2014  . Atrophic vaginitis 10/23/2014    Past  Surgical History:  Procedure Laterality Date  . ABDOMINAL HYSTERECTOMY Bilateral 1988  . KNEE SURGERY Left 2014    Family History  Problem Relation Age of Onset  . Osteoarthritis Mother   . Stroke Mother   . Heart disease Father   . Diabetes Brother   . Diabetes Maternal Grandfather     Social History   Social History  . Marital status: Married    Spouse name: N/A  . Number of children: N/A  . Years of  education: N/A   Occupational History  . Not on file.   Social History Main Topics  . Smoking status: Former Smoker    Packs/day: 1.00    Years: 50.00    Types: Cigarettes    Start date: 05/26/2016  . Smokeless tobacco: Never Used  . Alcohol use 0.0 oz/week  . Drug use: No  . Sexual activity: Yes   Other Topics Concern  . Not on file   Social History Narrative  . No narrative on file     Current Outpatient Prescriptions:  .  ALPRAZolam (XANAX) 0.5 MG tablet, Take 1 tablet (0.5 mg total) by mouth as needed for anxiety., Disp: 60 tablet, Rfl: 0 .  Calcium Citrate-Vitamin D (CALCIUM + D PO), Take by mouth., Disp: , Rfl:  .  escitalopram (LEXAPRO) 20 MG tablet, TAKE 1 TABLET (20 MG TOTAL) BY MOUTH DAILY., Disp: 90 tablet, Rfl: 1 .  levocetirizine (XYZAL) 5 MG tablet, Take 1 tablet (5 mg total) by mouth daily., Disp: 90 tablet, Rfl: 0 .  lisdexamfetamine (VYVANSE) 40 MG capsule, Take 1 capsule (40 mg total) by mouth daily. Start with one first week and after that 2 in am, Disp: 30 capsule, Rfl: 0 .  lisdexamfetamine (VYVANSE) 40 MG capsule, Take 1 capsule (40 mg total) by mouth daily. Fill October 06, 2016, Disp: 30 capsule, Rfl: 0 .  magnesium 30 MG tablet, Take 30 mg by mouth 2 (two) times daily., Disp: , Rfl:  .  Multiple Minerals (JOINT HEALTH MINERAL PO), Take 1 tablet by mouth daily., Disp: , Rfl:  .  vitamin E 100 UNIT capsule, Take by mouth daily., Disp: , Rfl:  .  aspirin EC 81 MG tablet, Take 1 tablet (81 mg total) by mouth daily., Disp: 30 tablet, Rfl: 0 .  Cholecalciferol (VITAMIN D) 2000 UNITS CAPS, Take 1 tablet by mouth daily., Disp: , Rfl:  .  lisdexamfetamine (VYVANSE) 40 MG capsule, Take 1 capsule (40 mg total) by mouth every morning., Disp: 30 capsule, Rfl: 0 .  olmesartan-hydrochlorothiazide (BENICAR HCT) 40-25 MG tablet, Take 1 tablet by mouth daily., Disp: 90 tablet, Rfl: 1 .  rosuvastatin (CRESTOR) 10 MG tablet, Take 1 tablet (10 mg total) by mouth daily., Disp:  90 tablet, Rfl: 1  No Known Allergies   ROS  Constitutional: Negative for fever , positive for weight change.  Respiratory: Positive  for dry cough but no  shortness of breath.   Cardiovascular: Negative for chest pain or palpitations.  Gastrointestinal: Negative for abdominal pain, no bowel changes.  Musculoskeletal: Negative for gait problem or joint swelling.  Skin: Negative for rash.  Neurological: Negative for dizziness or headache.  No other specific complaints in a complete review of systems (except as listed in HPI above).  Objective  Vitals:   09/06/16 1114  BP: 132/74  Pulse: 97  Resp: 16  Temp: 98.1 F (36.7 C)  TempSrc: Oral  SpO2: 96%  Weight: 243 lb (110.2 kg)  Height: 5\' 3"  (  1.6 m)    Body mass index is 43.05 kg/m.  Physical Exam  Constitutional: Patient appears well-developed and well-nourished. Obese  No distress.  HEENT: head atraumatic, normocephalic, pupils equal and reactive to light,  neck supple, throat within normal limits Cardiovascular: Normal rate, regular rhythm and normal heart sounds.  No murmur heard. No BLE edema. Pulmonary/Chest: Effort normal and breath sounds normal. No respiratory distress. Abdominal: Soft.  There is no tenderness. Psychiatric: Patient has a normal mood and affect. behavior is normal. Judgment and thought content normal.  Recent Results (from the past 2160 hour(s))  CBC with Differential/Platelet     Status: None   Collection Time: 07/05/16 11:14 AM  Result Value Ref Range   WBC 6.5 3.4 - 10.8 x10E3/uL   RBC 4.96 3.77 - 5.28 x10E6/uL   Hemoglobin 15.4 11.1 - 15.9 g/dL   Hematocrit 45.3 34.0 - 46.6 %   MCV 91 79 - 97 fL   MCH 31.0 26.6 - 33.0 pg   MCHC 34.0 31.5 - 35.7 g/dL   RDW 12.9 12.3 - 15.4 %   Platelets 319 150 - 379 x10E3/uL   Neutrophils 52 Not Estab. %   Lymphs 33 Not Estab. %   Monocytes 10 Not Estab. %   Eos 4 Not Estab. %   Basos 1 Not Estab. %   Neutrophils Absolute 3.5 1.4 - 7.0 x10E3/uL    Lymphocytes Absolute 2.1 0.7 - 3.1 x10E3/uL   Monocytes Absolute 0.6 0.1 - 0.9 x10E3/uL   EOS (ABSOLUTE) 0.2 0.0 - 0.4 x10E3/uL   Basophils Absolute 0.1 0.0 - 0.2 x10E3/uL   Immature Granulocytes 0 Not Estab. %   Immature Grans (Abs) 0.0 0.0 - 0.1 x10E3/uL  Comprehensive metabolic panel     Status: None   Collection Time: 07/05/16 11:14 AM  Result Value Ref Range   Glucose 96 65 - 99 mg/dL   BUN 18 8 - 27 mg/dL   Creatinine, Ser 0.65 0.57 - 1.00 mg/dL   GFR calc non Af Amer 93 >59 mL/min/1.73   GFR calc Af Amer 107 >59 mL/min/1.73   BUN/Creatinine Ratio 28 12 - 28   Sodium 141 134 - 144 mmol/L   Potassium 5.0 3.5 - 5.2 mmol/L   Chloride 103 96 - 106 mmol/L   CO2 24 18 - 29 mmol/L   Calcium 9.7 8.7 - 10.3 mg/dL   Total Protein 6.7 6.0 - 8.5 g/dL   Albumin 4.5 3.6 - 4.8 g/dL   Globulin, Total 2.2 1.5 - 4.5 g/dL   Albumin/Globulin Ratio 2.0 1.2 - 2.2   Bilirubin Total 0.4 0.0 - 1.2 mg/dL   Alkaline Phosphatase 92 39 - 117 IU/L   AST 18 0 - 40 IU/L   ALT 20 0 - 32 IU/L  Lipid Panel w/o Chol/HDL Ratio     Status: Abnormal   Collection Time: 07/05/16 11:14 AM  Result Value Ref Range   Cholesterol, Total 198 100 - 199 mg/dL   Triglycerides 149 0 - 149 mg/dL   HDL 50 >39 mg/dL   VLDL Cholesterol Cal 30 5 - 40 mg/dL   LDL Calculated 118 (H) 0 - 99 mg/dL  Hgb A1c w/o eAG     Status: None   Collection Time: 07/05/16 11:14 AM  Result Value Ref Range   Hgb A1c MFr Bld 5.6 4.8 - 5.6 %    Comment:          Pre-diabetes: 5.7 - 6.4  Diabetes: >6.4          Glycemic control for adults with diabetes: <7.0   Insulin, random     Status: None   Collection Time: 07/05/16 11:14 AM  Result Value Ref Range   INSULIN 14.6 2.6 - 24.9 uIU/mL      PHQ2/9: Depression screen [facility] 2/9 09/06/2016 07/07/2016 11/10/2015 05/09/2015 10/28/2014  Decreased Interest 0 0 0 0 0  Down, Depressed, Hopeless 0 0 0 1 0  PHQ - 2 Score 0 0 0 1 0     Fall Risk: Fall Risk  09/06/2016 07/07/2016 11/10/2015  05/09/2015 10/28/2014  Falls in the past year? No No No No Yes  Number falls in past yr: - - - - 1  Injury with Fall? - - - - Yes     Functional Status Survey: Is the patient deaf or have difficulty hearing?: No Does the patient have difficulty seeing, even when wearing glasses/contacts?: No Does the patient have difficulty concentrating, remembering, or making decisions?: No Does the patient have difficulty walking or climbing stairs?: No Does the patient have difficulty dressing or bathing?: No Does the patient have difficulty doing errands alone such as visiting a doctor's office or shopping?: No   Assessment & Plan  1. Metabolic syndrome  On life style modification  2. Morbid obesity, unspecified obesity type [facility])  Discussed with the patient the risk posed by an increased BMI. Discussed importance of portion control, calorie counting and at least 150 minutes of physical activity weekly. Avoid sweet beverages and drink more water. Eat at least 6 servings of fruit and vegetables daily  Doing well on Vyvanse  3. Atherosclerosis of aorta (HCC)  - aspirin EC 81 MG tablet; Take 1 tablet (81 mg total) by mouth daily.  Dispense: 30 tablet; Refill: 0 - rosuvastatin (CRESTOR) 10 MG tablet; Take 1 tablet (10 mg total) by mouth daily.  Dispense: 90 tablet; Refill: 1 - Ambulatory referral to Cardiology  4. Centrilobular emphysema ([place])  She denies SOB , has mild cough in am's but is dry, does not want to have spirometry of start medication at this time  5. Binge eating disorder  Doing well, lost 9 lbs in the past 3 months, we will adjust dose, no side effects, but advised to monitor bp at local pharmacy  - lisdexamfetamine (VYVANSE) 40 MG capsule; Take 1 capsule (40 mg total) by mouth daily. Start with one first week and after that 2 in am  Dispense: 30 capsule; Refill: 0 - lisdexamfetamine (VYVANSE) 40 MG capsule; Take 1 capsule (40 mg total) by mouth daily. Fill October 06, 2016   Dispense: 30 capsule; Refill: 0 - lisdexamfetamine (VYVANSE) 40 MG capsule; Take 1 capsule (40 mg total) by mouth every morning.  Dispense: 30 capsule; Refill: 0  6. Generalized anxiety disorder  stable  7. Benign hypertension  Changing from Valsartan because of recall - olmesartan-hydrochlorothiazide (BENICAR HCT) 40-25 MG tablet; Take 1 tablet by mouth daily.  Dispense: 90 tablet; Refill: 1  8. Coronary artery calcification  - aspirin EC 81 MG tablet; Take 1 tablet (81 mg total) by mouth daily.  Dispense: 30 tablet; Refill: 0 - rosuvastatin (CRESTOR) 10 MG tablet; Take 1 tablet (10 mg total) by mouth daily.  Dispense: 90 tablet; Refill: 1 - Ambulatory referral to Cardiology

## 2016-09-23 ENCOUNTER — Other Ambulatory Visit: Payer: Self-pay | Admitting: Otolaryngology

## 2016-09-23 DIAGNOSIS — H61892 Other specified disorders of left external ear: Secondary | ICD-10-CM

## 2016-09-23 DIAGNOSIS — H9012 Conductive hearing loss, unilateral, left ear, with unrestricted hearing on the contralateral side: Secondary | ICD-10-CM

## 2016-10-01 ENCOUNTER — Ambulatory Visit
Admission: RE | Admit: 2016-10-01 | Discharge: 2016-10-01 | Disposition: A | Discharge status: Home / Self Care | Payer: Federal, State, Local not specified - PPO | Source: Ambulatory Visit | Attending: Otolaryngology | Admitting: Otolaryngology

## 2016-10-01 DIAGNOSIS — H61892 Other specified disorders of left external ear: Secondary | ICD-10-CM

## 2016-10-01 DIAGNOSIS — H9012 Conductive hearing loss, unilateral, left ear, with unrestricted hearing on the contralateral side: Secondary | ICD-10-CM | POA: Diagnosis present

## 2016-10-07 ENCOUNTER — Encounter: Payer: Self-pay | Admitting: *Deleted

## 2016-10-14 NOTE — Progress Notes (Signed)
Cardiology Office Note  Date:  10/15/2016   ID:  Veronica Mendez, DOB 07/12/1950, MRN [id number]  PCP:  Veronica Sizer, MD   Chief Complaint  Patient presents with  . OTHER    Artherosclerosis and family Hx c/o chest pressure, jaw pain, unusual sweating and anxiety. Meds reviewed verbally with pt.    HPI:  Veronica Mendez is a 66 year old woman with past medical history of  . Binge Eating Disorder  . Obesity  . Nicotine Dependence, quit 05/2016, age 43  . Hypertension  . Hyperlipidemia, treated   Who presents by referral from Dr. Ancil Mendez for evaluation of aortic and coronary atherosclerosis, chest pain  Notes indicating she has history of metabolic syndrome,mphysema, long smoking history  Screening long CT scan showed aortic atherosclerosis and coronary calcifications Images pulled up in the office with her and reviewed, She has mild plaque in the left subclavian/innominate, mild plaquing in the aortic arch, mild plaque in the descending aorta, mild calcified plaque in the LAD and left circumflex  Recently changed to Crestor Started on aspirin  Denies any chest pain symptoms on exertion Active, working aggressively on her diet  Long discussion concerning family history, father with cardiac issues, mother lived to 97 had strokes  Lab work reviewed with her showing total cholesterol 180 HBa1C 5.6  EKG personally reviewed by myself on todays visit Shows normal sinus rhythm rate 88 bpm no significant ST or T-wave changes   PMH:   has a past medical history of Depression; Hyperlipidemia; and Obesity, Class III, BMI 40-49.9 (morbid obesity) ([place]) (11/10/2015).  PSH:    Past Surgical History:  Procedure Laterality Date  . ABDOMINAL HYSTERECTOMY Bilateral 1988  . KNEE SURGERY Left 2014    Current Outpatient Prescriptions  Medication Sig Dispense Refill  . Acetaminophen-Aspirin Buffered 250-250 MG tablet Take 1 tablet by mouth every 4 (four) hours as needed for fever.    .  ALPRAZolam (XANAX) 0.5 MG tablet Take 1 tablet (0.5 mg total) by mouth as needed for anxiety. 60 tablet 0  . aspirin EC 81 MG tablet Take 1 tablet (81 mg total) by mouth daily. 30 tablet 0  . calcium carbonate (TUMS - DOSED IN MG ELEMENTAL CALCIUM) 500 MG chewable tablet Chew 1 tablet by mouth as needed for indigestion or heartburn.    . Calcium Citrate-Vitamin D (CALCIUM + D PO) Take by mouth daily.     Marland Kitchen escitalopram (LEXAPRO) 20 MG tablet TAKE 1 TABLET (20 MG TOTAL) BY MOUTH DAILY. 90 tablet 1  . levocetirizine (XYZAL) 5 MG tablet Take 1 tablet (5 mg total) by mouth daily. 90 tablet 0  . lisdexamfetamine (VYVANSE) 40 MG capsule Take 1 capsule (40 mg total) by mouth every morning. 30 capsule 0  . magnesium gluconate (MAGONATE) 500 MG tablet Take 500 mg by mouth daily.    . Misc Natural Products (GLUCOSAMINE CHOND COMPLEX/MSM) TABS Take by mouth daily.    Marland Kitchen olmesartan-hydrochlorothiazide (BENICAR HCT) 40-25 MG tablet Take 1 tablet by mouth daily. 90 tablet 1  . rosuvastatin (CRESTOR) 10 MG tablet Take 1 tablet (10 mg total) by mouth daily. 90 tablet 1  . Simethicone (GAS RELIEF PO) Take by mouth as needed.    Marland Kitchen VITAMIN E PO Take by mouth daily.     No current facility-administered medications for this visit.      Allergies:   Patient has no known allergies.   Social History:  The patient  reports that she has quit smoking. Her smoking  use included Cigarettes. She started smoking about 4 months ago. She has a 50.00 pack-year smoking history. She has never used smokeless tobacco. She reports that she drinks alcohol. She reports that she does not use drugs.   Family History:   family history includes Diabetes in her brother and maternal grandfather; Heart Problems in her brother; Heart attack in her brother, brother, paternal uncle, paternal uncle, paternal uncle, paternal uncle, paternal uncle, paternal uncle, and paternal uncle; Heart disease in her brother and father; Osteoarthritis in her  mother; Peripheral Artery Disease in her sister; Stroke in her brother and mother.    Review of Systems: Review of Systems  Constitutional: Negative.   Respiratory: Negative.   Cardiovascular: Positive for chest pain.  Gastrointestinal: Negative.   Musculoskeletal: Negative.   Neurological: Negative.   Psychiatric/Behavioral: Negative.   All other systems reviewed and are negative.    PHYSICAL EXAM: VS:  BP 108/69 (BP Location: Right Arm, Patient Position: Sitting, Cuff Size: Large)   Pulse 88   Ht 5\' 3"  (1.6 m)   Wt 238 lb 4 oz (108.1 kg)   BMI 42.20 kg/m  , BMI Body mass index is 42.2 kg/m. GEN: Well nourished, well developed, in no acute distress  HEENT: normal  Neck: no JVD, carotid bruits, or masses Cardiac: RRR; no murmurs, rubs, or gallops,no edema  Respiratory:  clear to auscultation bilaterally, normal work of breathing GI: soft, nontender, nondistended, + BS MS: no deformity or atrophy  Skin: warm and dry, no rash Neuro:  Strength and sensation are intact Psych: euthymic mood, full affect    Recent Labs: 07/05/2016: ALT 20; BUN 18; Creatinine, Ser 0.65; Hemoglobin 15.4; Platelets 319; Potassium 5.0; Sodium 141    Lipid Panel Lab Results  Component Value Date   CHOL 198 07/05/2016   HDL 50 07/05/2016   LDLCALC 118 (H) 07/05/2016   TRIG 149 07/05/2016      Wt Readings from Last 3 Encounters:  10/15/16 238 lb 4 oz (108.1 kg)  09/06/16 243 lb (110.2 kg)  07/28/16 244 lb (110.7 kg)       ASSESSMENT AND PLAN:  Coronary artery calcification - Plan: EKG 12-Lead Minimal coronary calcifications, No significant symptoms concerning for angina Would treat with aggressive cholesterol management,  goal LDL less than 70  Atherosclerosis of aorta (HCC) - Plan: EKG 12-Lead Very mild aortic atherosclerosis, again would treat with aggressive lifestyle and cholesterol treatment  Benign hypertension - Plan: EKG 12-Lead Blood pressure is well controlled on  today's visit. No changes made to the medications.  Dyslipidemia - Plan: EKG 12-Lead Recent he start on Crestor 10 g daily, tolerating this well  Atypical chest pain Active at baseline, no reproducible symptoms, very minimal coronary calcifications seen on CT scan, no further testing needed Recommended symptoms get worse tachycardia with exertion that she call our office for more testing  Disposition:   F/U as needed   Total encounter time more than 60 minutes  Greater than 50% was spent in counseling and coordination of care with the patient  Patient seen in consultation for Dr. Ancil Mendez will be referred back to her office for ongoing care of the issues detailed above   Orders Placed This Encounter  Procedures  . EKG 12-Lead     Signed, Esmond Plants, M.D., Ph.D. 10/15/2016  [place], [place]

## 2016-10-15 ENCOUNTER — Encounter: Payer: Self-pay | Admitting: Cardiovascular Disease

## 2016-10-15 ENCOUNTER — Ambulatory Visit (INDEPENDENT_AMBULATORY_CARE_PROVIDER_SITE_OTHER): Payer: Federal, State, Local not specified - PPO | Admitting: Cardiovascular Disease

## 2016-10-15 VITALS — BP 108/69 | HR 88 | Ht 63.0 in | Wt 238.2 lb

## 2016-10-15 DIAGNOSIS — I2584 Coronary atherosclerosis due to calcified coronary lesion: Secondary | ICD-10-CM | POA: Diagnosis not present

## 2016-10-15 DIAGNOSIS — I2510 Atherosclerotic heart disease of native coronary artery without angina pectoris: Secondary | ICD-10-CM | POA: Diagnosis not present

## 2016-10-15 DIAGNOSIS — I700 Atherosclerosis of aorta: Secondary | ICD-10-CM

## 2016-10-15 DIAGNOSIS — E785 Hyperlipidemia, unspecified: Secondary | ICD-10-CM

## 2016-10-15 DIAGNOSIS — I10 Essential (primary) hypertension: Secondary | ICD-10-CM | POA: Diagnosis not present

## 2016-10-15 DIAGNOSIS — R079 Chest pain, unspecified: Secondary | ICD-10-CM

## 2016-10-15 NOTE — Patient Instructions (Addendum)
Medication Instructions:   No medication changes made  Labwork:  No new labs needed  Testing/Procedures:  No further testing at this time   Follow-Up: It was a pleasure seeing you in the office today. Please call us if you have new issues that need to be addressed before your next appt.  [phone]  Your physician wants you to follow-up in:  As needed  If you need a refill on your cardiac medications before your next appointment, please call your pharmacy.     

## 2016-10-20 ENCOUNTER — Other Ambulatory Visit: Payer: Self-pay | Admitting: Family Medicine

## 2016-10-20 DIAGNOSIS — I10 Essential (primary) hypertension: Secondary | ICD-10-CM

## 2016-10-20 NOTE — Telephone Encounter (Signed)
Patient requesting refill of Diovan to [facility].

## 2016-11-08 ENCOUNTER — Ambulatory Visit: Payer: Federal, State, Local not specified - PPO | Admitting: Family Medicine

## 2016-12-08 ENCOUNTER — Encounter: Payer: Self-pay | Admitting: Family Medicine

## 2016-12-08 ENCOUNTER — Other Ambulatory Visit: Payer: Self-pay | Admitting: Family Medicine

## 2016-12-08 ENCOUNTER — Ambulatory Visit (INDEPENDENT_AMBULATORY_CARE_PROVIDER_SITE_OTHER): Payer: Federal, State, Local not specified - PPO | Admitting: Family Medicine

## 2016-12-08 VITALS — BP 124/74 | HR 96 | Temp 97.7°F | Resp 16 | Ht 63.0 in | Wt 231.6 lb

## 2016-12-08 DIAGNOSIS — J302 Other seasonal allergic rhinitis: Secondary | ICD-10-CM

## 2016-12-08 DIAGNOSIS — F330 Major depressive disorder, recurrent, mild: Secondary | ICD-10-CM

## 2016-12-08 DIAGNOSIS — I2510 Atherosclerotic heart disease of native coronary artery without angina pectoris: Secondary | ICD-10-CM

## 2016-12-08 DIAGNOSIS — F411 Generalized anxiety disorder: Secondary | ICD-10-CM

## 2016-12-08 DIAGNOSIS — I2584 Coronary atherosclerosis due to calcified coronary lesion: Secondary | ICD-10-CM

## 2016-12-08 DIAGNOSIS — E8881 Metabolic syndrome: Secondary | ICD-10-CM

## 2016-12-08 DIAGNOSIS — F5081 Binge eating disorder: Secondary | ICD-10-CM

## 2016-12-08 DIAGNOSIS — I700 Atherosclerosis of aorta: Secondary | ICD-10-CM | POA: Diagnosis not present

## 2016-12-08 DIAGNOSIS — J3089 Other allergic rhinitis: Secondary | ICD-10-CM

## 2016-12-08 DIAGNOSIS — Z23 Encounter for immunization: Secondary | ICD-10-CM

## 2016-12-08 DIAGNOSIS — F50819 Binge eating disorder, unspecified: Secondary | ICD-10-CM

## 2016-12-08 DIAGNOSIS — I10 Essential (primary) hypertension: Secondary | ICD-10-CM

## 2016-12-08 MED ORDER — DESVENLAFAXINE SUCCINATE ER 50 MG PO TB24: 50.0000 mg | ORAL_TABLET | Freq: Every day | ORAL | 1 refills | Status: DC

## 2016-12-08 MED ORDER — PRISTIQ 50 MG PO TB24: 50.0000 mg | ORAL_TABLET | Freq: Every day | ORAL | 1 refills | Status: DC

## 2016-12-08 MED ORDER — LISDEXAMFETAMINE DIMESYLATE 40 MG PO CAPS: 40.0000 mg | ORAL_CAPSULE | ORAL | 0 refills | Status: DC

## 2016-12-08 MED ORDER — ALPRAZOLAM 0.5 MG PO TABS: 0.5000 mg | ORAL_TABLET | ORAL | 0 refills | Status: DC | PRN

## 2016-12-08 MED ORDER — MONTELUKAST SODIUM 10 MG PO TABS: 10.0000 mg | ORAL_TABLET | Freq: Every day | ORAL | 1 refills | Status: DC

## 2016-12-08 NOTE — Progress Notes (Signed)
Name: Veronica Mendez   MRN: [id number]    DOB: 17-Jul-1950   Date:12/08/2016       Progress Note  Subjective  Chief Complaint  Chief Complaint  Patient presents with  . Medication Refill    3 month F/U  . Hypertension    Headaches-sinus related ?  . Obesity    Has losted 21 pounds since April due well with Vyvanse  . Binge Eating Disorder  . Hyperlipidemia  . Depression    Wanted in to increase Lexapro medication due to pain from FMS  . FMS    Has more pain in the colder weather  . Allergic Rhinitis     Has been having watery eyes, sneezing, coughing. Wants to change medication.    HPI  HTN: bp is at goal,, continue Benicar HCTZ for bp and is at goal, no chest pain or palpitation  Hyperlipidemia:  She was found to have calcification of coronary vessels found on CT chest, we will change to high dose statin and she is going to start aspirin  Depression/GAD: she states depression is no longer under control, she has been on Lexapro for many years, unable to take Cymbalta.  She retired , and was feeling well, but shorter days has affected her mood again, she is not crying. She tries to expose herself to the sunlight but willing to change medication.    FMS: she feels better during Summer months, the cold weather makes symptoms worse, so she has been feeling much more achy today. She states her body pain is about 3/10, she is doing better since she retired because she can rest more often during the day, average 3/10 , taking Ibuprofen very seldom now. She is still walking.. Advised Tylenol for knee pain, she does not want to take Lyrica or Gabapentin.   Obesity: We gave her a rx of Vyvanse back in 10/2015 for binge eating disorder, but she was afraid to fill it. She continues to have problems not over eating and feels guilty. She started medication April 2018 at a weight of 252 lbs.  She likes to eat food - not necessarily desserts. She never tried laxatives or throwing up to help  with weight loss. She has lost 21 lbs since started on medication   Metabolic Syndrome: she has polyphagia, no polydipsia or polyuria. She had elevated fasting insulin.   Centrolobular emphysema: she has mild dry cough in am's, but no SOB or wheezing. She states worse when laying down , she does not want to use an inhaler  Atherosclerosis , aorta : discussed results from CT chest done, she is on high dose Crestor and aspirin  Lung nodule: previous smoker, going back for repeat in 6 months. Dr. Grayland Ormond will contact her in Nov, to repeat in Dec, patient is aware  AR: symptoms are worse, advised to take Xyzal daily, using nasal saline we will add singulair  Patient Active Problem List   Diagnosis Date Noted  . Coronary artery calcification 09/06/2016  . Lung nodule, solitary 07/30/2016  . Atherosclerosis of aorta ([place]) 07/30/2016  . Centrilobular emphysema ([place]) 07/30/2016  . Personal history of tobacco use, presenting hazards to health 07/30/2016  . Binge eating disorder 11/10/2015  . Hyperglycemia 07/09/2015  . Radiculitis of left cervical region 10/28/2014  . Vitamin D deficiency 10/26/2014  . Lumbosacral spondylosis without myelopathy 10/26/2014  . Extreme obesity 10/26/2014  . De Quervain's disease (radial styloid tenosynovitis) 10/26/2014  . Nephrolithiasis 10/23/2014  . Trigger finger, right  10/23/2014  . Carpal tunnel syndrome 10/23/2014  . Generalized anxiety disorder 10/23/2014  . Major depression ([place]) 10/23/2014  . Fibromyalgia 10/23/2014  . Perennial allergic rhinitis 10/23/2014  . Dyslipidemia 10/23/2014  . Benign hypertension 10/23/2014  . Morbid drug-induced obesity 10/23/2014  . Insomnia 10/23/2014  . Metabolic syndrome 56/38/7564  . Tobacco use disorder 10/23/2014  . Menopause syndrome 10/23/2014  . Lumbago 10/23/2014  . Osteoarthritis 10/23/2014  . Atrophic vaginitis 10/23/2014    Past Surgical History:  Procedure Laterality Date  . ABDOMINAL  HYSTERECTOMY Bilateral 1988  . KNEE SURGERY Left 2014    Family History  Problem Relation Age of Onset  . Osteoarthritis Mother   . Stroke Mother   . Heart disease Father   . Peripheral Artery Disease Sister   . Diabetes Brother   . Diabetes Maternal Grandfather   . Heart attack Paternal Uncle   . Heart attack Paternal Uncle   . Heart attack Paternal Uncle   . Heart attack Paternal Uncle   . Heart attack Paternal Uncle   . Heart attack Paternal Uncle   . Heart attack Paternal Uncle   . Heart attack Brother   . Heart Problems Brother   . Heart attack Brother   . Stroke Brother   . Heart disease Brother     Social History   Social History  . Marital status: Married    Spouse name: N/A  . Number of children: N/A  . Years of education: N/A   Occupational History  . Not on file.   Social History Main Topics  . Smoking status: Former Smoker    Packs/day: 1.00    Years: 50.00    Types: Cigarettes    Start date: 05/26/2016  . Smokeless tobacco: Never Used  . Alcohol use 0.0 oz/week  . Drug use: No  . Sexual activity: Yes   Other Topics Concern  . Not on file   Social History Narrative  . No narrative on file     Current Outpatient Prescriptions:  .  ALPRAZolam (XANAX) 0.5 MG tablet, Take 1 tablet (0.5 mg total) by mouth as needed for anxiety., Disp: 60 tablet, Rfl: 0 .  aspirin EC 81 MG tablet, Take 1 tablet (81 mg total) by mouth daily., Disp: 30 tablet, Rfl: 0 .  calcium carbonate (TUMS - DOSED IN MG ELEMENTAL CALCIUM) 500 MG chewable tablet, Chew 1 tablet by mouth as needed for indigestion or heartburn., Disp: , Rfl:  .  levocetirizine (XYZAL) 5 MG tablet, Take 1 tablet (5 mg total) by mouth daily., Disp: 90 tablet, Rfl: 0 .  lisdexamfetamine (VYVANSE) 40 MG capsule, Take 1 capsule (40 mg total) by mouth every morning., Disp: 30 capsule, Rfl: 0 .  magnesium gluconate (MAGONATE) 500 MG tablet, Take 500 mg by mouth daily., Disp: , Rfl:  .  Misc Natural Products  (GLUCOSAMINE CHOND COMPLEX/MSM) TABS, Take by mouth daily., Disp: , Rfl:  .  olmesartan-hydrochlorothiazide (BENICAR HCT) 40-25 MG tablet, Take 1 tablet by mouth daily., Disp: 90 tablet, Rfl: 1 .  rosuvastatin (CRESTOR) 10 MG tablet, Take 1 tablet (10 mg total) by mouth daily., Disp: 90 tablet, Rfl: 1 .  Simethicone (GAS RELIEF PO), Take by mouth as needed., Disp: , Rfl:  .  Vitamin D, Cholecalciferol, 1000 units TABS, Take 1 tablet by mouth daily., Disp: , Rfl:  .  VITAMIN E PO, Take by mouth daily., Disp: , Rfl:  .  Acetaminophen-Aspirin Buffered 250-250 MG tablet, Take 1 tablet by mouth  every 4 (four) hours as needed for fever., Disp: , Rfl:  .  Calcium Citrate-Vitamin D (CALCIUM + D PO), Take by mouth daily. , Disp: , Rfl:  .  desvenlafaxine (PRISTIQ) 50 MG 24 hr tablet, Take 1 tablet (50 mg total) by mouth daily., Disp: 30 tablet, Rfl: 1 .  lisdexamfetamine (VYVANSE) 40 MG capsule, Take 1 capsule (40 mg total) by mouth every morning. Fill 01/07/2017, Disp: 30 capsule, Rfl: 0 .  lisdexamfetamine (VYVANSE) 40 MG capsule, Take 1 capsule (40 mg total) by mouth every morning. Fill on 02/05/2017, Disp: 30 capsule, Rfl: 0 .  montelukast (SINGULAIR) 10 MG tablet, Take 1 tablet (10 mg total) by mouth at bedtime., Disp: 90 tablet, Rfl: 1  No Known Allergies   ROS  Constitutional: Negative for fever, positive for  weight change.  Respiratory: Negative for cough and shortness of breath.   Cardiovascular: Negative for chest pain or palpitations.  Gastrointestinal: Negative for abdominal pain, no bowel changes.  Musculoskeletal: Negative for gait problem or joint swelling.  Skin: Negative for rash.  Neurological: Negative for dizziness or headache.  No other specific complaints in a complete review of systems (except as listed in HPI above).  Objective  Vitals:   12/08/16 1101  BP: 124/74  Pulse: 96  Resp: 16  Temp: 97.7 F (36.5 C)  TempSrc: Oral  SpO2: 97%  Weight: 231 lb 9.6 oz (105.1  kg)  Height: 5\' 3"  (1.6 m)    Body mass index is 41.03 kg/m.  Physical Exam  Constitutional: Patient appears well-developed and well-nourished. Obese No distress.  HEENT: head atraumatic, normocephalic, pupils equal and reactive to light, neck supple, throat within normal limits Cardiovascular: Normal rate, regular rhythm and normal heart sounds.  No murmur heard. No BLE edema. Pulmonary/Chest: Effort normal and breath sounds normal. No respiratory distress. Abdominal: Soft.  There is no tenderness. Psychiatric: Patient has a normal mood and affect. behavior is normal. Judgment and thought content normal. Muscular skeletal: trigger points positive   PHQ2/9: Depression screen [facility] 2/9 12/08/2016 09/06/2016 07/07/2016 11/10/2015 05/09/2015  Decreased Interest 2 0 0 0 0  Down, Depressed, Hopeless 1 0 0 0 1  PHQ - 2 Score 3 0 0 0 1  Altered sleeping 1 - - - -  Tired, decreased energy 1 - - - -  Change in appetite 1 - - - -  Feeling bad or failure about yourself  0 - - - -  Trouble concentrating 1 - - - -  Moving slowly or fidgety/restless 1 - - - -  Suicidal thoughts 0 - - - -  PHQ-9 Score 8 - - - -  Difficult doing work/chores Somewhat difficult - - - -     Fall Risk: Fall Risk  12/08/2016 09/06/2016 07/07/2016 11/10/2015 05/09/2015  Falls in the past year? No No No No No  Number falls in past yr: - - - - -  Injury with Fall? - - - - -  Comment - - - - -     Functional Status Survey: Is the patient deaf or have difficulty hearing?: No Does the patient have difficulty seeing, even when wearing glasses/contacts?: No Does the patient have difficulty concentrating, remembering, or making decisions?: No Does the patient have difficulty walking or climbing stairs?: No Does the patient have difficulty dressing or bathing?: No Does the patient have difficulty doing errands alone such as visiting a doctor's office or shopping?: No    Assessment & Plan  1. Binge  eating  disorder  Doing well on medication  - lisdexamfetamine (VYVANSE) 40 MG capsule; Take 1 capsule (40 mg total) by mouth every morning.  Dispense: 30 capsule; Refill: 0 - lisdexamfetamine (VYVANSE) 40 MG capsule; Take 1 capsule (40 mg total) by mouth every morning. Fill 01/07/2017  Dispense: 30 capsule; Refill: 0 - lisdexamfetamine (VYVANSE) 40 MG capsule; Take 1 capsule (40 mg total) by mouth every morning. Fill on 02/05/2017  Dispense: 30 capsule; Refill: 0  2. Need for immunization against influenza  - Flu vaccine HIGH DOSE PF (Fluzone High dose)  3. Morbid obesity, unspecified obesity type ([place])  She needs to resume   4. Atherosclerosis of aorta ([facility])  Continue statin and aspirin   5. Metabolic syndrome   6. Generalized anxiety disorder  - ALPRAZolam (XANAX) 0.5 MG tablet; Take 1 tablet (0.5 mg total) by mouth as needed for anxiety.  Dispense: 60 tablet; Refill: 0  7. Benign hypertension  Continue medication  8. Coronary artery calcification   9. Depression, major, recurrent, mild ([facility])  - desvenlafaxine (PRISTIQ) 50 MG 24 hr tablet; Take 1 tablet (50 mg total) by mouth daily.  Dispense: 30 tablet; Refill: 1  10. Perennial allergic rhinitis with seasonal variation  - montelukast (SINGULAIR) 10 MG tablet; Take 1 tablet (10 mg total) by mouth at bedtime.  Dispense: 90 tablet; Refill: 1

## 2016-12-10 ENCOUNTER — Other Ambulatory Visit: Payer: Self-pay | Admitting: Family Medicine

## 2016-12-10 ENCOUNTER — Telehealth: Payer: Self-pay | Admitting: Family Medicine

## 2016-12-10 MED ORDER — DESVENLAFAXINE SUCCINATE ER 50 MG PO TB24: 50.0000 mg | ORAL_TABLET | Freq: Every day | ORAL | 1 refills | Status: DC

## 2016-12-10 NOTE — Telephone Encounter (Signed)
Please notify patient, sent generic to pharmacy

## 2016-12-10 NOTE — Telephone Encounter (Signed)
Per pt says that the pharm told her that the insurance will not cover this medication  But in conversation with Dr Ancil Boozer the patient needs this medication and that they do not have this in genric per Dr. Rockey Situ pt to call pharm and have them send [place] a request for Prior Auth.

## 2016-12-10 NOTE — Telephone Encounter (Signed)
I have not received a PA on this patient but there is a generic Desvenlafaxine ER.  Another brand is [place].  Please advise

## 2016-12-10 NOTE — Telephone Encounter (Signed)
Copied from [place] [id number]. Topic: Quick Communication - See Telephone Encounter >> Dec 10, 2016  1:22 PM Boyd Kerbs wrote: CRM for notification. See Telephone encounter for:  12/10/16.  Needs to have prescription for Pristiq in a generic.  [facility] [place]. She was in on Wednesday and dr. Ancil Boozer changed her prescription. Must be generic

## 2016-12-13 NOTE — Telephone Encounter (Signed)
Pt.notified

## 2016-12-17 ENCOUNTER — Telehealth: Payer: Self-pay | Admitting: Family Medicine

## 2016-12-17 NOTE — Telephone Encounter (Signed)
Patient is unable to afford Pristiq and has to keep in Tier 1 classification. Could you please change to another medication. Thanks

## 2016-12-17 NOTE — Telephone Encounter (Signed)
Copied from [place] [phone]. Topic: Quick Communication - See Telephone Encounter >> Dec 17, 2016  1:59 PM Aurelio Brash B wrote: CRM for notification. See Telephone encounter for:  12/17/16. Pt asking for generic pristiq

## 2016-12-22 MED ORDER — CITALOPRAM HYDROBROMIDE 40 MG PO TABS: 40.0000 mg | ORAL_TABLET | Freq: Every day | ORAL | 1 refills | Status: DC

## 2016-12-22 NOTE — Telephone Encounter (Signed)
Patient states the generic Pristiq was too expensive was as well-$30 a month and Citalopram generic for Celexa. This once is covered by insurance-comes in 20 mg and 40 mg. Which one do you suggest. Which one do you suggest? We will also need to push her appointment back due to not being able to start her new medication yet and was suppose to be a 1 month Follow up due to starting new medication.

## 2016-12-22 NOTE — Telephone Encounter (Signed)
Copied from [place] [id number]. Topic: General - Other >> Dec 21, 2016  2:45 PM [place], Laban Emperor wrote: Reason for CRM: Patient states she will like for [facility] or [facility] to give her a call back about her medication.

## 2017-01-04 ENCOUNTER — Ambulatory Visit: Payer: Federal, State, Local not specified - PPO | Admitting: Family Medicine

## 2017-01-17 ENCOUNTER — Telehealth: Payer: Self-pay | Admitting: *Deleted

## 2017-01-17 DIAGNOSIS — Z87891 Personal history of nicotine dependence: Secondary | ICD-10-CM

## 2017-01-17 DIAGNOSIS — Z122 Encounter for screening for malignant neoplasm of respiratory organs: Secondary | ICD-10-CM

## 2017-01-17 DIAGNOSIS — R918 Other nonspecific abnormal finding of lung field: Secondary | ICD-10-CM

## 2017-01-17 NOTE — Telephone Encounter (Signed)
Notified patient that lung cancer screening low dose CT scan follow up imaging is due currently or will be in near future. Confirmed that patient is within the age range of 55-77, and asymptomatic, (no signs or symptoms of lung cancer). Patient denies illness that would prevent curative treatment for lung cancer if found. Verified smoking history, (current, 50.25 pack year). The shared decision making visit was done 07/28/16. Patient is agreeable for CT scan being scheduled.

## 2017-01-28 ENCOUNTER — Ambulatory Visit
Admission: RE | Admit: 2017-01-28 | Discharge: 2017-01-28 | Disposition: A | Discharge status: Home / Self Care | Payer: Federal, State, Local not specified - PPO | Source: Ambulatory Visit | Attending: Nurse Practitioner | Admitting: Nurse Practitioner

## 2017-01-28 DIAGNOSIS — N200 Calculus of kidney: Secondary | ICD-10-CM | POA: Diagnosis not present

## 2017-01-28 DIAGNOSIS — I2510 Atherosclerotic heart disease of native coronary artery without angina pectoris: Secondary | ICD-10-CM | POA: Diagnosis not present

## 2017-01-28 DIAGNOSIS — Z87891 Personal history of nicotine dependence: Secondary | ICD-10-CM | POA: Diagnosis not present

## 2017-01-28 DIAGNOSIS — J439 Emphysema, unspecified: Secondary | ICD-10-CM | POA: Insufficient documentation

## 2017-01-28 DIAGNOSIS — R918 Other nonspecific abnormal finding of lung field: Secondary | ICD-10-CM | POA: Insufficient documentation

## 2017-01-28 DIAGNOSIS — Z122 Encounter for screening for malignant neoplasm of respiratory organs: Secondary | ICD-10-CM | POA: Insufficient documentation

## 2017-01-28 DIAGNOSIS — I700 Atherosclerosis of aorta: Secondary | ICD-10-CM | POA: Insufficient documentation

## 2017-01-31 ENCOUNTER — Encounter: Payer: Self-pay | Admitting: *Deleted

## 2017-02-01 ENCOUNTER — Other Ambulatory Visit: Payer: Self-pay | Admitting: Family Medicine

## 2017-02-01 NOTE — Telephone Encounter (Signed)
Refill request for general medication: Celexa 40 mg  Last office visit: 12/08/2016  Last physical exam: 05/09/2015  Follow up visit: 02/02/2017 (Tomorrow)

## 2017-02-02 ENCOUNTER — Ambulatory Visit: Payer: Federal, State, Local not specified - PPO | Admitting: Family Medicine

## 2017-02-26 ENCOUNTER — Other Ambulatory Visit: Payer: Self-pay | Admitting: Family Medicine

## 2017-02-26 DIAGNOSIS — I2584 Coronary atherosclerosis due to calcified coronary lesion: Secondary | ICD-10-CM

## 2017-02-26 DIAGNOSIS — I700 Atherosclerosis of aorta: Secondary | ICD-10-CM

## 2017-02-26 DIAGNOSIS — I2510 Atherosclerotic heart disease of native coronary artery without angina pectoris: Secondary | ICD-10-CM

## 2017-02-27 ENCOUNTER — Other Ambulatory Visit: Payer: Self-pay | Admitting: Family Medicine

## 2017-02-27 DIAGNOSIS — I10 Essential (primary) hypertension: Secondary | ICD-10-CM

## 2017-03-14 ENCOUNTER — Ambulatory Visit: Payer: Federal, State, Local not specified - PPO | Admitting: Family Medicine

## 2017-03-16 ENCOUNTER — Ambulatory Visit: Payer: Federal, State, Local not specified - PPO | Admitting: Family Medicine

## 2017-03-16 ENCOUNTER — Encounter: Payer: Self-pay | Admitting: Family Medicine

## 2017-03-16 VITALS — BP 116/68 | HR 87 | Temp 97.8°F | Resp 16 | Ht 63.0 in | Wt 226.6 lb

## 2017-03-16 DIAGNOSIS — E785 Hyperlipidemia, unspecified: Secondary | ICD-10-CM | POA: Diagnosis not present

## 2017-03-16 DIAGNOSIS — I2510 Atherosclerotic heart disease of native coronary artery without angina pectoris: Secondary | ICD-10-CM

## 2017-03-16 DIAGNOSIS — J3089 Other allergic rhinitis: Secondary | ICD-10-CM | POA: Diagnosis not present

## 2017-03-16 DIAGNOSIS — I10 Essential (primary) hypertension: Secondary | ICD-10-CM

## 2017-03-16 DIAGNOSIS — I700 Atherosclerosis of aorta: Secondary | ICD-10-CM

## 2017-03-16 DIAGNOSIS — J432 Centrilobular emphysema: Secondary | ICD-10-CM

## 2017-03-16 DIAGNOSIS — I2584 Coronary atherosclerosis due to calcified coronary lesion: Secondary | ICD-10-CM | POA: Diagnosis not present

## 2017-03-16 DIAGNOSIS — F330 Major depressive disorder, recurrent, mild: Secondary | ICD-10-CM

## 2017-03-16 DIAGNOSIS — F5081 Binge eating disorder: Secondary | ICD-10-CM

## 2017-03-16 DIAGNOSIS — E8881 Metabolic syndrome: Secondary | ICD-10-CM

## 2017-03-16 DIAGNOSIS — J302 Other seasonal allergic rhinitis: Secondary | ICD-10-CM

## 2017-03-16 MED ORDER — LISDEXAMFETAMINE DIMESYLATE 40 MG PO CAPS: 40.0000 mg | ORAL_CAPSULE | ORAL | 0 refills | Status: DC

## 2017-03-16 MED ORDER — MONTELUKAST SODIUM 10 MG PO TABS: 10.0000 mg | ORAL_TABLET | Freq: Every day | ORAL | 1 refills | Status: DC

## 2017-03-16 MED ORDER — LEVOCETIRIZINE DIHYDROCHLORIDE 5 MG PO TABS: 5.0000 mg | ORAL_TABLET | Freq: Every day | ORAL | 1 refills | Status: DC

## 2017-03-16 MED ORDER — VENLAFAXINE HCL ER 37.5 MG PO CP24: 37.5000 mg | ORAL_CAPSULE | Freq: Every day | ORAL | 0 refills | Status: DC

## 2017-03-16 NOTE — Progress Notes (Signed)
Name: Veronica Mendez   MRN: [id number]    DOB: 1950/06/26   Date:03/16/2017       Progress Note  Subjective  Chief Complaint  Chief Complaint  Patient presents with  . Medication Refill    Please only prescribe Generics due to Insurance  . Hyperlipidemia  . Hypertension    Denies any symptoms  . Binge-Eating Disorder    Doing well has losted more 6 pounds since last visit  . Depression    Increased Celexa from 20 mg to 40 mg and could not tell a difference  . FMS    Worst during winter  . Allergic Rhinitis     Stable with medication    HPI  HTN: bp is at goal,, continue Benicar HCTZ for bp and is at goal, no chest pain or palpitation  Hyperlipidemia:  She was found to have calcification of coronary vessels found on CT chest, she is on high dose statin now,  she is going to start aspirin  Depression/GAD: she states depression is no longer under control, she has been on Lexapro for many years, unable to take Cymbalta we switched to Citalopram and did not help, she is wiling to try Effexor. She states she has lack of motivation, feels tired, she states she is not confrontational and her husband is fussy all the time.   FMS: she feels better during Summer months, the cold weather makes symptoms worse, so she has been feeling much more achy today. She states her body pain is about 3/10, she is doing better since she retired and also since she has been avoiding refined sugars, average 3/10 , taking Ibuprofen very seldom now. She is still walking.. Advised Tylenol for knee pain, she does not want to take Lyrica or Gabapentin.   Obesity: We gave her a rx of Vyvanse back in 10/2015 for binge eating disorder, but she was afraid to fill it. She continues to have problems not over eating and feels guilty. She started medication April 2018 at a weight of 252 lbs.  She likes to eat food - not necessarily desserts. She never tried laxatives or throwing up to help with weight loss. She has lost  26  lbs since started on medication  She is avoid potatoes, rice, bread and pasta, and is doing much better in terms of FMS and aches and pains  Metabolic Syndrome: she has polyphagia, no polydipsia or polyuria. She had elevated fasting insulin, she is doing well with life style modification and we will recheck next visit   Centrolobular emphysema: she has mild dry cough in am's, but no SOB or wheezing. She states worse when laying down , she does not want to use an inhaler  Atherosclerosis , aorta : discussed results from CT chest done, she is on high dose Crestor and aspirin, seen by cardiologist and given reassurance  Lung nodule: previous smoker, she went back in Dec, stable in size and repeat in one year. Ordered by Dr. Grayland Ormond.   AR: she states she is doing better with Singulair at night and Xyzal during the day. She states worse with strong scents.   Patient Active Problem List   Diagnosis Date Noted  . Coronary artery calcification 09/06/2016  . Lung nodule, solitary 07/30/2016  . Atherosclerosis of aorta ([place]) 07/30/2016  . Centrilobular emphysema ([place]) 07/30/2016  . Personal history of tobacco use, presenting hazards to health 07/30/2016  . Binge eating disorder 11/10/2015  . Hyperglycemia 07/09/2015  . Radiculitis of  left cervical region 10/28/2014  . Vitamin D deficiency 10/26/2014  . Lumbosacral spondylosis without myelopathy 10/26/2014  . Extreme obesity 10/26/2014  . De Quervain's disease (radial styloid tenosynovitis) 10/26/2014  . Nephrolithiasis 10/23/2014  . Trigger finger, right 10/23/2014  . Carpal tunnel syndrome 10/23/2014  . Generalized anxiety disorder 10/23/2014  . Major depression ([place]) 10/23/2014  . Fibromyalgia 10/23/2014  . Perennial allergic rhinitis 10/23/2014  . Dyslipidemia 10/23/2014  . Benign hypertension 10/23/2014  . Morbid drug-induced obesity 10/23/2014  . Insomnia 10/23/2014  . Metabolic syndrome 50/53/9767  . Tobacco use disorder  10/23/2014  . Menopause syndrome 10/23/2014  . Lumbago 10/23/2014  . Osteoarthritis 10/23/2014  . Atrophic vaginitis 10/23/2014    Past Surgical History:  Procedure Laterality Date  . ABDOMINAL HYSTERECTOMY Bilateral 1988  . KNEE SURGERY Left 2014    Family History  Problem Relation Age of Onset  . Osteoarthritis Mother   . Stroke Mother   . Heart disease Father   . Peripheral Artery Disease Sister   . Diabetes Brother   . Diabetes Maternal Grandfather   . Heart attack Paternal Uncle   . Heart attack Paternal Uncle   . Heart attack Paternal Uncle   . Heart attack Paternal Uncle   . Heart attack Paternal Uncle   . Heart attack Paternal Uncle   . Heart attack Paternal Uncle   . Heart attack Brother   . Heart Problems Brother   . Heart attack Brother   . Stroke Brother   . Heart disease Brother     Social History   Socioeconomic History  . Marital status: Married    Spouse name: Not on file  . Number of children: Not on file  . Years of education: Not on file  . Highest education level: Not on file  Social Needs  . Financial resource strain: Not on file  . Food insecurity - worry: Not on file  . Food insecurity - inability: Not on file  . Transportation needs - medical: Not on file  . Transportation needs - non-medical: Not on file  Occupational History  . Not on file  Tobacco Use  . Smoking status: Former Smoker    Packs/day: 1.00    Years: 50.00    Pack years: 50.00    Types: Cigarettes    Start date: 05/26/2016  . Smokeless tobacco: Never Used  Substance and Sexual Activity  . Alcohol use: Yes    Alcohol/week: 0.0 oz  . Drug use: No  . Sexual activity: Yes  Other Topics Concern  . Not on file  Social History Narrative  . Not on file     Current Outpatient Medications:  .  Acetaminophen-Aspirin Buffered 250-250 MG tablet, Take 1 tablet by mouth every 4 (four) hours as needed for fever., Disp: , Rfl:  .  ALPRAZolam (XANAX) 0.5 MG tablet, Take 1  tablet (0.5 mg total) by mouth as needed for anxiety., Disp: 60 tablet, Rfl: 0 .  aspirin EC 81 MG tablet, Take 1 tablet (81 mg total) by mouth daily., Disp: 30 tablet, Rfl: 0 .  calcium carbonate (TUMS - DOSED IN MG ELEMENTAL CALCIUM) 500 MG chewable tablet, Chew 1 tablet by mouth as needed for indigestion or heartburn., Disp: , Rfl:  .  Calcium Citrate-Vitamin D (CALCIUM + D PO), Take by mouth daily. , Disp: , Rfl:  .  levocetirizine (XYZAL) 5 MG tablet, Take 1 tablet (5 mg total) by mouth daily., Disp: 90 tablet, Rfl: 1 .  magnesium  gluconate (MAGONATE) 500 MG tablet, Take 500 mg by mouth daily., Disp: , Rfl:  .  Misc Natural Products (GLUCOSAMINE CHOND COMPLEX/MSM) TABS, Take by mouth daily., Disp: , Rfl:  .  montelukast (SINGULAIR) 10 MG tablet, Take 1 tablet (10 mg total) by mouth at bedtime., Disp: 90 tablet, Rfl: 1 .  olmesartan-hydrochlorothiazide (BENICAR HCT) 40-25 MG tablet, TAKE 1 TABLET BY MOUTH EVERY DAY, Disp: 90 tablet, Rfl: 0 .  rosuvastatin (CRESTOR) 10 MG tablet, TAKE 1 TABLET BY MOUTH EVERY DAY, Disp: 90 tablet, Rfl: 1 .  Simethicone (GAS RELIEF PO), Take by mouth as needed., Disp: , Rfl:  .  Vitamin D, Cholecalciferol, 1000 units TABS, Take 1 tablet by mouth daily., Disp: , Rfl:  .  VITAMIN E PO, Take by mouth daily., Disp: , Rfl:  .  lisdexamfetamine (VYVANSE) 40 MG capsule, Take 1 capsule (40 mg total) by mouth every morning., Disp: 30 capsule, Rfl: 0 .  lisdexamfetamine (VYVANSE) 40 MG capsule, Take 1 capsule (40 mg total) by mouth every morning. Fill 04/15/2017, Disp: 30 capsule, Rfl: 0 .  lisdexamfetamine (VYVANSE) 40 MG capsule, Take 1 capsule (40 mg total) by mouth every morning., Disp: 30 capsule, Rfl: 0 .  venlafaxine XR (EFFEXOR-XR) 37.5 MG 24 hr capsule, Take 1-2 capsules (37.5-75 mg total) by mouth daily with breakfast. For one week, after that 2 daily, Disp: 60 capsule, Rfl: 0  No Known Allergies   ROS  Constitutional: Negative for fever, positive for mild   weight change.  Respiratory: Positive  for intermittent cough but no shortness of breath.   Cardiovascular: Negative for chest pain or palpitations.  Gastrointestinal: Negative for abdominal pain, no bowel changes.  Musculoskeletal: Negative for gait problem or joint swelling.  Skin: Negative for rash.  Neurological: Negative for dizziness or headache.  No other specific complaints in a complete review of systems (except as listed in HPI above).  Objective  Vitals:   03/16/17 1108  BP: 116/68  Pulse: 87  Resp: 16  Temp: 97.8 F (36.6 C)  TempSrc: Oral  SpO2: 98%  Weight: 226 lb 9.6 oz (102.8 kg)  Height: 5\' 3"  (1.6 m)    Body mass index is 40.14 kg/m.  Physical Exam   Constitutional: Patient appears well-developed and well-nourished. Obese  No distress.  HEENT: head atraumatic, normocephalic, pupils equal and reactive to light,  neck supple, throat within normal limits Cardiovascular: Normal rate, regular rhythm and normal heart sounds.  No murmur heard. No BLE edema. Pulmonary/Chest: Effort normal and breath sounds normal. No respiratory distress. Abdominal: Soft.  There is no tenderness. Psychiatric: Patient has a normal mood and affect. behavior is normal. Judgment and thought content normal.  PHQ2/9: Depression screen [facility] 2/9 03/16/2017 12/08/2016 09/06/2016 07/07/2016 11/10/2015  Decreased Interest 1 2 0 0 0  Down, Depressed, Hopeless 1 1 0 0 0  PHQ - 2 Score 2 3 0 0 0  Altered sleeping 0 1 - - -  Tired, decreased energy 1 1 - - -  Change in appetite 1 1 - - -  Feeling bad or failure about yourself  1 0 - - -  Trouble concentrating 1 1 - - -  Moving slowly or fidgety/restless 0 1 - - -  Suicidal thoughts 0 0 - - -  PHQ-9 Score 6 8 - - -  Difficult doing work/chores Somewhat difficult Somewhat difficult - - -     Fall Risk: Fall Risk  03/16/2017 12/08/2016 09/06/2016 07/07/2016 11/10/2015  Falls in  the past year? No No No No No  Number falls in past yr: - - - - -   Injury with Fall? - - - - -  Comment - - - - -     Functional Status Survey: Is the patient deaf or have difficulty hearing?: No Does the patient have difficulty seeing, even when wearing glasses/contacts?: No Does the patient have difficulty concentrating, remembering, or making decisions?: No Does the patient have difficulty walking or climbing stairs?: No Does the patient have difficulty dressing or bathing?: No Does the patient have difficulty doing errands alone such as visiting a doctor's office or shopping?: No    Assessment & Plan  1. Atherosclerosis of aorta (HCC)  Continue aspirin and statin therapy   2. Perennial allergic rhinitis  - levocetirizine (XYZAL) 5 MG tablet; Take 1 tablet (5 mg total) by mouth daily.  Dispense: 90 tablet; Refill: 1  3. Perennial allergic rhinitis with seasonal variation  - montelukast (SINGULAIR) 10 MG tablet; Take 1 tablet (10 mg total) by mouth at bedtime.  Dispense: 90 tablet; Refill: 1  4. Binge eating disorder  - lisdexamfetamine (VYVANSE) 40 MG capsule; Take 1 capsule (40 mg total) by mouth every morning.  Dispense: 30 capsule; Refill: 0 - lisdexamfetamine (VYVANSE) 40 MG capsule; Take 1 capsule (40 mg total) by mouth every morning. Fill 04/15/2017  Dispense: 30 capsule; Refill: 0 - lisdexamfetamine (VYVANSE) 40 MG capsule; Take 1 capsule (40 mg total) by mouth every morning.  Dispense: 30 capsule; Refill: 0  5. Coronary artery calcification   6. Morbid obesity, unspecified obesity type ([place])  Doing well on Vyvanse  7. Metabolic syndrome  Losing weight.   8. Dyslipidemia  On Crestor  9. Centrilobular emphysema (HCC)  No SOB, she still has a mild cough, does not want medication  10. Benign hypertension  At goal, continue medication   11. Depression, major, recurrent, mild (HCC)  - venlafaxine XR (EFFEXOR-XR) 37.5 MG 24 hr capsule; Take 1-2 capsules (37.5-75 mg total) by mouth daily with breakfast. For one week,  after that 2 daily  Dispense: 60 capsule; Refill: 0

## 2017-03-16 NOTE — Patient Instructions (Signed)
Overlap Lexapro with Effexor 37.5 for one week, after that stop Lexapro and take two capsules of Effexor 37.5 mg daily

## 2017-04-14 ENCOUNTER — Encounter: Payer: Self-pay | Admitting: Family Medicine

## 2017-04-14 ENCOUNTER — Ambulatory Visit: Payer: Federal, State, Local not specified - PPO | Admitting: Family Medicine

## 2017-04-14 VITALS — BP 100/60 | HR 84 | Resp 14 | Ht 63.0 in | Wt 230.1 lb

## 2017-04-14 DIAGNOSIS — F330 Major depressive disorder, recurrent, mild: Secondary | ICD-10-CM | POA: Diagnosis not present

## 2017-04-14 DIAGNOSIS — F411 Generalized anxiety disorder: Secondary | ICD-10-CM | POA: Diagnosis not present

## 2017-04-14 MED ORDER — VENLAFAXINE HCL ER 75 MG PO CP24: 75.0000 mg | ORAL_CAPSULE | Freq: Every day | ORAL | 0 refills | Status: DC

## 2017-04-14 NOTE — Progress Notes (Signed)
Name: Veronica Mendez   MRN: [id number]    DOB: [birth year]   Date:04/14/2017       Progress Note  Subjective  Chief Complaint  No chief complaint on file.   HPI  Depression/GAD: she states depression isno longer under control, she has been on Lexapro for many years, unable to take Cymbalta we switched to Citalopram and did not help, we started her on Effexor 02/2016. She states that it seems to be working, sleeping better. Her mood has improved, she is not getting offended easily.    Patient Active Problem List   Diagnosis Date Noted  . Coronary artery calcification 09/06/2016  . Lung nodule, solitary 07/30/2016  . Atherosclerosis of aorta ([place]) 07/30/2016  . Centrilobular emphysema ([place]) 07/30/2016  . Personal history of tobacco use, presenting hazards to health 07/30/2016  . Binge eating disorder 11/10/2015  . Hyperglycemia 07/09/2015  . Radiculitis of left cervical region 10/28/2014  . Vitamin D deficiency 10/26/2014  . Lumbosacral spondylosis without myelopathy 10/26/2014  . Extreme obesity 10/26/2014  . De Quervain's disease (radial styloid tenosynovitis) 10/26/2014  . Nephrolithiasis 10/23/2014  . Trigger finger, right 10/23/2014  . Carpal tunnel syndrome 10/23/2014  . Generalized anxiety disorder 10/23/2014  . Major depression ([place]) 10/23/2014  . Fibromyalgia 10/23/2014  . Perennial allergic rhinitis 10/23/2014  . Dyslipidemia 10/23/2014  . Benign hypertension 10/23/2014  . Morbid drug-induced obesity 10/23/2014  . Insomnia 10/23/2014  . Metabolic syndrome 40/98/1191  . Tobacco use disorder 10/23/2014  . Menopause syndrome 10/23/2014  . Lumbago 10/23/2014  . Osteoarthritis 10/23/2014  . Atrophic vaginitis 10/23/2014    Past Surgical History:  Procedure Laterality Date  . ABDOMINAL HYSTERECTOMY Bilateral 1988  . KNEE SURGERY Left 2014    Family History  Problem Relation Age of Onset  . Osteoarthritis Mother   . Stroke Mother   . Heart disease Father   .  Peripheral Artery Disease Sister   . Diabetes Brother   . Diabetes Maternal Grandfather   . Heart attack Paternal Uncle   . Heart attack Paternal Uncle   . Heart attack Paternal Uncle   . Heart attack Paternal Uncle   . Heart attack Paternal Uncle   . Heart attack Paternal Uncle   . Heart attack Paternal Uncle   . Heart attack Brother   . Heart Problems Brother   . Heart attack Brother   . Stroke Brother   . Heart disease Brother     Social History   Socioeconomic History  . Marital status: Married    Spouse name: Not on file  . Number of children: Not on file  . Years of education: Not on file  . Highest education level: Not on file  Social Needs  . Financial resource strain: Not on file  . Food insecurity - worry: Not on file  . Food insecurity - inability: Not on file  . Transportation needs - medical: Not on file  . Transportation needs - non-medical: Not on file  Occupational History  . Not on file  Tobacco Use  . Smoking status: Former Smoker    Packs/day: 1.00    Years: 50.00    Pack years: 50.00    Types: Cigarettes    Start date: 05/26/2016  . Smokeless tobacco: Never Used  Substance and Sexual Activity  . Alcohol use: Yes    Alcohol/week: 0.0 oz  . Drug use: No  . Sexual activity: Yes  Other Topics Concern  . Not on file  Social History  Narrative  . Not on file     Current Outpatient Medications:  .  Acetaminophen-Aspirin Buffered 250-250 MG tablet, Take 1 tablet by mouth every 4 (four) hours as needed for fever., Disp: , Rfl:  .  ALPRAZolam (XANAX) 0.5 MG tablet, Take 1 tablet (0.5 mg total) by mouth as needed for anxiety., Disp: 60 tablet, Rfl: 0 .  aspirin EC 81 MG tablet, Take 1 tablet (81 mg total) by mouth daily., Disp: 30 tablet, Rfl: 0 .  calcium carbonate (TUMS - DOSED IN MG ELEMENTAL CALCIUM) 500 MG chewable tablet, Chew 1 tablet by mouth as needed for indigestion or heartburn., Disp: , Rfl:  .  Calcium Citrate-Vitamin D (CALCIUM + D PO),  Take by mouth daily. , Disp: , Rfl:  .  levocetirizine (XYZAL) 5 MG tablet, Take 1 tablet (5 mg total) by mouth daily., Disp: 90 tablet, Rfl: 1 .  lisdexamfetamine (VYVANSE) 40 MG capsule, Take 1 capsule (40 mg total) by mouth every morning., Disp: 30 capsule, Rfl: 0 .  lisdexamfetamine (VYVANSE) 40 MG capsule, Take 1 capsule (40 mg total) by mouth every morning. Fill 04/15/2017, Disp: 30 capsule, Rfl: 0 .  lisdexamfetamine (VYVANSE) 40 MG capsule, Take 1 capsule (40 mg total) by mouth every morning., Disp: 30 capsule, Rfl: 0 .  magnesium gluconate (MAGONATE) 500 MG tablet, Take 500 mg by mouth daily., Disp: , Rfl:  .  Misc Natural Products (GLUCOSAMINE CHOND COMPLEX/MSM) TABS, Take by mouth daily., Disp: , Rfl:  .  montelukast (SINGULAIR) 10 MG tablet, Take 1 tablet (10 mg total) by mouth at bedtime., Disp: 90 tablet, Rfl: 1 .  olmesartan-hydrochlorothiazide (BENICAR HCT) 40-25 MG tablet, TAKE 1 TABLET BY MOUTH EVERY DAY, Disp: 90 tablet, Rfl: 0 .  rosuvastatin (CRESTOR) 10 MG tablet, TAKE 1 TABLET BY MOUTH EVERY DAY, Disp: 90 tablet, Rfl: 1 .  Simethicone (GAS RELIEF PO), Take by mouth as needed., Disp: , Rfl:  .  venlafaxine XR (EFFEXOR-XR) 37.5 MG 24 hr capsule, Take 1-2 capsules (37.5-75 mg total) by mouth daily with breakfast. For one week, after that 2 daily, Disp: 60 capsule, Rfl: 0 .  Vitamin D, Cholecalciferol, 1000 units TABS, Take 1 tablet by mouth daily., Disp: , Rfl:  .  VITAMIN E PO, Take by mouth daily., Disp: , Rfl:   No Known Allergies   ROS  Constitutional: Negative for fever or weight change.  Respiratory: Negative for cough and shortness of breath.   Cardiovascular: Negative for chest pain or palpitations.  Gastrointestinal: Negative for abdominal pain, no bowel changes.  Musculoskeletal: Negative for gait problem or joint swelling.  Skin: Negative for rash.  Neurological: Negative for dizziness or headache.  No other specific complaints in a complete review of systems  (except as listed in HPI above).  Objective  Vitals:   04/14/17 1114  BP: 100/60  Pulse: 84  Resp: 14  SpO2: 98%  Weight: 230 lb 1.6 oz (104.4 kg)  Height: 5\' 3"  (1.6 m)    Body mass index is 40.76 kg/m.  Physical Exam  Constitutional: Patient appears well-developed and well-nourished. Obese  No distress.  HEENT: head atraumatic, normocephalic, pupils equal and reactive to light, neck supple, throat within normal limits Cardiovascular: Normal rate, regular rhythm and normal heart sounds.  No murmur heard. No BLE edema. Pulmonary/Chest: Effort normal and breath sounds normal. No respiratory distress. Abdominal: Soft.  There is no tenderness. Psychiatric: Patient has a normal mood and affect. behavior is normal. Judgment and thought content normal.  PHQ2/9: Depression screen [facility] 2/9 04/14/2017 03/16/2017 12/08/2016 09/06/2016 07/07/2016  Decreased Interest 0 1 2 0 0  Down, Depressed, Hopeless 0 1 1 0 0  PHQ - 2 Score 0 2 3 0 0  Altered sleeping 0 0 1 - -  Tired, decreased energy 0 1 1 - -  Change in appetite 0 1 1 - -  Feeling bad or failure about yourself  0 1 0 - -  Trouble concentrating 0 1 1 - -  Moving slowly or fidgety/restless 0 0 1 - -  Suicidal thoughts 0 0 0 - -  PHQ-9 Score 0 6 8 - -  Difficult doing work/chores Not difficult at all Somewhat difficult Somewhat difficult - -     Fall Risk: Fall Risk  04/14/2017 04/14/2017 03/16/2017 12/08/2016 09/06/2016  Falls in the past year? No No No No No  Number falls in past yr: - - - - -  Injury with Fall? - - - - -  Comment - - - - -     Functional Status Survey: Is the patient deaf or have difficulty hearing?: No Does the patient have difficulty seeing, even when wearing glasses/contacts?: No Does the patient have difficulty concentrating, remembering, or making decisions?: No Does the patient have difficulty walking or climbing stairs?: No Does the patient have difficulty dressing or bathing?: No Does the patient  have difficulty doing errands alone such as visiting a doctor's office or shopping?: No    Assessment & Plan  1. Depression, major, recurrent, mild (HCC)  - venlafaxine XR (EFFEXOR-XR) 75 MG 24 hr capsule; Take 1 capsule (75 mg total) by mouth daily with breakfast. For one week, after that 2 daily  Dispense: 90 capsule; Refill: 0  2. Generalized anxiety disorder  - venlafaxine XR (EFFEXOR-XR) 75 MG 24 hr capsule; Take 1 capsule (75 mg total) by mouth daily with breakfast. For one week, after that 2 daily  Dispense: 90 capsule; Refill: 0

## 2017-05-11 ENCOUNTER — Other Ambulatory Visit: Payer: Self-pay

## 2017-05-11 DIAGNOSIS — F411 Generalized anxiety disorder: Secondary | ICD-10-CM

## 2017-05-11 DIAGNOSIS — F330 Major depressive disorder, recurrent, mild: Secondary | ICD-10-CM

## 2017-05-11 MED ORDER — VENLAFAXINE HCL ER 75 MG PO CP24: 75.0000 mg | ORAL_CAPSULE | Freq: Every day | ORAL | 0 refills | Status: DC

## 2017-05-11 NOTE — Telephone Encounter (Signed)
Refill request for general medication. Venlafaxine to [facility] for a 90 day supply.   Last office visit: 04/14/2017  Follow up on 07/13/2017  Called to inquire if patient is taking 1 capsule or two daily?

## 2017-05-12 ENCOUNTER — Telehealth: Payer: Self-pay | Admitting: Family Medicine

## 2017-05-12 NOTE — Telephone Encounter (Signed)
Copied from [place] [id number]. Topic: Quick Communication - Office Called Patient >> May 11, 2017  2:22 PM Vonna Kotyk, [place] wrote: Reason for CRM: Refill request on [facility]ine to [facility]. Need to know if patient titrated up to 2 capsules daily or is she still only taking 1 daily? >> May 11, 2017  2:29 PM Lovena Le [place] L wrote: She is taking one a day

## 2017-05-12 NOTE — Telephone Encounter (Signed)
Pt returned called and states she is taking one capsule of Venlafaxine daily.

## 2017-05-13 ENCOUNTER — Other Ambulatory Visit: Payer: Self-pay | Admitting: Family Medicine

## 2017-05-13 NOTE — Telephone Encounter (Signed)
Patient notified and will start new dosage called into pharmacy.

## 2017-05-13 NOTE — Telephone Encounter (Signed)
She needs to take a total of 75 mg daily. We started at 37.5 for one week.  I sent a refill for 75 one capsule daily

## 2017-05-28 ENCOUNTER — Other Ambulatory Visit: Payer: Self-pay | Admitting: Family Medicine

## 2017-05-28 DIAGNOSIS — I10 Essential (primary) hypertension: Secondary | ICD-10-CM

## 2017-05-30 ENCOUNTER — Other Ambulatory Visit: Payer: Self-pay | Admitting: Family Medicine

## 2017-05-30 DIAGNOSIS — F330 Major depressive disorder, recurrent, mild: Secondary | ICD-10-CM

## 2017-05-30 DIAGNOSIS — F411 Generalized anxiety disorder: Secondary | ICD-10-CM

## 2017-05-30 NOTE — Telephone Encounter (Signed)
Last Cr and K+ reviewed; Rx approved in primary's absence

## 2017-06-08 ENCOUNTER — Encounter: Payer: Self-pay | Admitting: Family Medicine

## 2017-06-08 ENCOUNTER — Ambulatory Visit: Payer: Federal, State, Local not specified - PPO | Admitting: Family Medicine

## 2017-06-08 VITALS — BP 112/82 | HR 90 | Temp 98.0°F | Resp 18 | Ht 63.0 in | Wt 226.1 lb

## 2017-06-08 DIAGNOSIS — W57XXXA Bitten or stung by nonvenomous insect and other nonvenomous arthropods, initial encounter: Secondary | ICD-10-CM

## 2017-06-08 DIAGNOSIS — R21 Rash and other nonspecific skin eruption: Secondary | ICD-10-CM | POA: Diagnosis not present

## 2017-06-08 MED ORDER — LIDOCAINE HCL (PF) 1 % IJ SOLN
2.0000 mL | Freq: Once | INTRAMUSCULAR | Status: AC
  Administered 2017-06-08: 2 mL via INTRADERMAL

## 2017-06-08 NOTE — Progress Notes (Addendum)
Name: Veronica Mendez   MRN: [id number]    DOB: 03/28/1950   Date:06/08/2017       Progress Note  Subjective  Chief Complaint  Chief Complaint  Patient presents with  . Tick Removal    HPI  Patient had a tic bite 8 days ago, family tried to remove it but some of the tic was left behind, it is causing irritation and itching sensation, no joint aches, fever or chills, rash in only localized on right shoulder around the tic bite  Patient Active Problem List   Diagnosis Date Noted  . Coronary artery calcification 09/06/2016  . Lung nodule, solitary 07/30/2016  . Atherosclerosis of aorta ([place]) 07/30/2016  . Centrilobular emphysema ([place]) 07/30/2016  . Personal history of tobacco use, presenting hazards to health 07/30/2016  . Binge eating disorder 11/10/2015  . Hyperglycemia 07/09/2015  . Radiculitis of left cervical region 10/28/2014  . Vitamin D deficiency 10/26/2014  . Lumbosacral spondylosis without myelopathy 10/26/2014  . Extreme obesity 10/26/2014  . De Quervain's disease (radial styloid tenosynovitis) 10/26/2014  . Nephrolithiasis 10/23/2014  . Trigger finger, right 10/23/2014  . Carpal tunnel syndrome 10/23/2014  . Generalized anxiety disorder 10/23/2014  . Major depression ([place]) 10/23/2014  . Fibromyalgia 10/23/2014  . Perennial allergic rhinitis 10/23/2014  . Dyslipidemia 10/23/2014  . Benign hypertension 10/23/2014  . Morbid drug-induced obesity 10/23/2014  . Insomnia 10/23/2014  . Metabolic syndrome 13/24/4010  . Tobacco use disorder 10/23/2014  . Menopause syndrome 10/23/2014  . Lumbago 10/23/2014  . Osteoarthritis 10/23/2014  . Atrophic vaginitis 10/23/2014    Social History   Tobacco Use  . Smoking status: Former Smoker    Packs/day: 1.00    Years: 50.00    Pack years: 50.00    Types: Cigarettes    Start date: 05/26/2016  . Smokeless tobacco: Never Used  Substance Use Topics  . Alcohol use: Yes    Alcohol/week: 0.0 oz     Current Outpatient  Medications:  .  Acetaminophen-Aspirin Buffered 250-250 MG tablet, Take 1 tablet by mouth every 4 (four) hours as needed for fever., Disp: , Rfl:  .  ALPRAZolam (XANAX) 0.5 MG tablet, Take 1 tablet (0.5 mg total) by mouth as needed for anxiety., Disp: 60 tablet, Rfl: 0 .  aspirin EC 81 MG tablet, Take 1 tablet (81 mg total) by mouth daily., Disp: 30 tablet, Rfl: 0 .  calcium carbonate (TUMS - DOSED IN MG ELEMENTAL CALCIUM) 500 MG chewable tablet, Chew 1 tablet by mouth as needed for indigestion or heartburn., Disp: , Rfl:  .  Calcium Citrate-Vitamin D (CALCIUM + D PO), Take by mouth daily. , Disp: , Rfl:  .  levocetirizine (XYZAL) 5 MG tablet, Take 1 tablet (5 mg total) by mouth daily., Disp: 90 tablet, Rfl: 1 .  lisdexamfetamine (VYVANSE) 40 MG capsule, Take 1 capsule (40 mg total) by mouth every morning., Disp: 30 capsule, Rfl: 0 .  lisdexamfetamine (VYVANSE) 40 MG capsule, Take 1 capsule (40 mg total) by mouth every morning. Fill 04/15/2017, Disp: 30 capsule, Rfl: 0 .  lisdexamfetamine (VYVANSE) 40 MG capsule, Take 1 capsule (40 mg total) by mouth every morning., Disp: 30 capsule, Rfl: 0 .  magnesium gluconate (MAGONATE) 500 MG tablet, Take 500 mg by mouth daily., Disp: , Rfl:  .  Misc Natural Products (GLUCOSAMINE CHOND COMPLEX/MSM) TABS, Take by mouth daily., Disp: , Rfl:  .  montelukast (SINGULAIR) 10 MG tablet, Take 1 tablet (10 mg total) by mouth at bedtime., Disp: 90  tablet, Rfl: 1 .  olmesartan-hydrochlorothiazide (BENICAR HCT) 40-25 MG tablet, TAKE 1 TABLET BY MOUTH EVERY DAY, Disp: 90 tablet, Rfl: 0 .  rosuvastatin (CRESTOR) 10 MG tablet, TAKE 1 TABLET BY MOUTH EVERY DAY, Disp: 90 tablet, Rfl: 1 .  Simethicone (GAS RELIEF PO), Take by mouth as needed., Disp: , Rfl:  .  venlafaxine XR (EFFEXOR-XR) 75 MG 24 hr capsule, Take 1 capsule (75 mg total) by mouth daily with breakfast., Disp: 90 capsule, Rfl: 0 .  Vitamin D, Cholecalciferol, 1000 units TABS, Take 1 tablet by mouth daily., Disp: ,  Rfl:  .  VITAMIN E PO, Take by mouth daily., Disp: , Rfl:   No Known Allergies  ROS  Not done  Objective  Vitals:   06/08/17 1417  BP: 112/82  Pulse: 90  Resp: 18  Temp: 98 F (36.7 C)  TempSrc: Oral  SpO2: 96%  Weight: 226 lb 1.6 oz (102.6 kg)    Body mass index is 40.05 kg/m.    Physical Exam  Constitutional: Patient appears well-developed and well-nourished. Obese  No distress.  HEENT: head atraumatic, normocephalic, pupils equal and reactive to light,neck supple, throat within normal limits Cardiovascular: Normal rate, regular rhythm and normal heart sounds.  No murmur heard. No BLE edema. Pulmonary/Chest: Effort normal and breath sounds normal. No respiratory distress. Abdominal: Soft.  There is no tenderness. Skin: right upper back area of redness with foreign body in the center, mild erythema surrounding the area, no oozing Psychiatric: Patient has a normal mood and affect. behavior is normal. Judgment and thought content normal.  Assessment & Plan  1. Rash in adult   2. Tick bite with subsequent removal of tic  Consent form signed Area localized Lidocaine 1 % 1 cc used for anesthesia 18 gauge needle used to removed fragments of tick Patient tolerated procedure well, No complications Monitor for infection

## 2017-06-09 ENCOUNTER — Encounter: Payer: Self-pay | Admitting: Family Medicine

## 2017-06-29 ENCOUNTER — Ambulatory Visit: Payer: Self-pay

## 2017-06-29 ENCOUNTER — Other Ambulatory Visit: Payer: Self-pay | Admitting: Family Medicine

## 2017-06-29 DIAGNOSIS — F411 Generalized anxiety disorder: Secondary | ICD-10-CM

## 2017-06-29 MED ORDER — ALPRAZOLAM 0.5 MG PO TABS: 0.5000 mg | ORAL_TABLET | ORAL | 0 refills | Status: DC | PRN

## 2017-06-29 NOTE — Telephone Encounter (Signed)
Patient called and says "I had a death in my family last night, my nephew who I raised had a massive heart attack and died. I have been crying non-stop, can't sleep and I need something to help me get through the coming days ahead. I need to be there for my sister, but the way I am now, I can't help her." I asked is she taking her depression medication and if she's having thoughts of harming herself, she says "yes, I take it everyday and Lord no, I wouldn't dare do anything like that to myself. I am not suicidal, just need something to calm my nerves so I can function during this time. He is a donor, so that will be a process for that and we don't know when the arrangements will be made." I gave my condolences and advised this would be sent to Dr. Ancil Boozer for review and recommendation, advised someone will call her back with her decision, she verbalized understanding.  Reason for Disposition . [1] Caller requesting NON-URGENT health information AND [2] PCP's office is the best resource  Answer Assessment - Initial Assessment Questions 1. REASON FOR CALL or QUESTION: "What is your reason for calling today?" or "How can I best help you?" or "What question do you have that I can help answer?"     I need something to help me get through the death of my family member  Protocols used: INFORMATION ONLY CALL-A-AH

## 2017-07-13 ENCOUNTER — Ambulatory Visit: Payer: Federal, State, Local not specified - PPO | Admitting: Family Medicine

## 2017-07-13 ENCOUNTER — Encounter: Payer: Self-pay | Admitting: Family Medicine

## 2017-07-13 VITALS — BP 118/62 | HR 100 | Temp 98.6°F | Resp 16 | Ht 63.0 in | Wt 220.3 lb

## 2017-07-13 DIAGNOSIS — J3089 Other allergic rhinitis: Secondary | ICD-10-CM

## 2017-07-13 DIAGNOSIS — F5081 Binge eating disorder: Secondary | ICD-10-CM | POA: Diagnosis not present

## 2017-07-13 DIAGNOSIS — I2510 Atherosclerotic heart disease of native coronary artery without angina pectoris: Secondary | ICD-10-CM

## 2017-07-13 DIAGNOSIS — R739 Hyperglycemia, unspecified: Secondary | ICD-10-CM

## 2017-07-13 DIAGNOSIS — I2584 Coronary atherosclerosis due to calcified coronary lesion: Secondary | ICD-10-CM

## 2017-07-13 DIAGNOSIS — I700 Atherosclerosis of aorta: Secondary | ICD-10-CM | POA: Diagnosis not present

## 2017-07-13 DIAGNOSIS — F331 Major depressive disorder, recurrent, moderate: Secondary | ICD-10-CM | POA: Diagnosis not present

## 2017-07-13 DIAGNOSIS — I10 Essential (primary) hypertension: Secondary | ICD-10-CM | POA: Diagnosis not present

## 2017-07-13 DIAGNOSIS — J302 Other seasonal allergic rhinitis: Secondary | ICD-10-CM

## 2017-07-13 DIAGNOSIS — F411 Generalized anxiety disorder: Secondary | ICD-10-CM | POA: Diagnosis not present

## 2017-07-13 DIAGNOSIS — E785 Hyperlipidemia, unspecified: Secondary | ICD-10-CM | POA: Diagnosis not present

## 2017-07-13 DIAGNOSIS — Z1231 Encounter for screening mammogram for malignant neoplasm of breast: Secondary | ICD-10-CM

## 2017-07-13 DIAGNOSIS — E8881 Metabolic syndrome: Secondary | ICD-10-CM | POA: Diagnosis not present

## 2017-07-13 MED ORDER — LISDEXAMFETAMINE DIMESYLATE 40 MG PO CAPS: 40.0000 mg | ORAL_CAPSULE | ORAL | 0 refills | Status: DC

## 2017-07-13 MED ORDER — ROSUVASTATIN CALCIUM 10 MG PO TABS: 10.0000 mg | ORAL_TABLET | Freq: Every day | ORAL | 1 refills | Status: DC

## 2017-07-13 MED ORDER — OLMESARTAN MEDOXOMIL-HCTZ 40-25 MG PO TABS: 1.0000 | ORAL_TABLET | Freq: Every day | ORAL | 1 refills | Status: DC

## 2017-07-13 MED ORDER — VENLAFAXINE HCL ER 150 MG PO CP24
150.0000 mg | ORAL_CAPSULE | Freq: Every day | ORAL | 0 refills | Status: DC
Start: 2017-07-13 — End: 2017-10-10

## 2017-07-13 MED ORDER — VENLAFAXINE HCL ER 75 MG PO CP24: 75.0000 mg | ORAL_CAPSULE | Freq: Every day | ORAL | 1 refills | Status: DC

## 2017-07-13 MED ORDER — MONTELUKAST SODIUM 10 MG PO TABS: 10.0000 mg | ORAL_TABLET | Freq: Every day | ORAL | 1 refills | Status: DC

## 2017-07-13 MED ORDER — LORATADINE 10 MG PO TABS
10.0000 mg | ORAL_TABLET | Freq: Every day | ORAL | 11 refills | Status: DC
Start: 2017-07-13 — End: 2018-12-29

## 2017-07-13 NOTE — Progress Notes (Signed)
Name: Veronica Mendez   MRN: [id number]    DOB: 1950-03-18   Date:07/13/2017       Progress Note  Subjective  Chief Complaint  Chief Complaint  Patient presents with  . Depression    Nephew recently passed away from heart disease and it upset.    HPI  HTN: bp is at goal,, continue Benicar HCTZ for bp and is at goal, no chest pain or palpitation  Hyperlipidemia: She was found to have calcification of coronary vessels found on CT chest, she is on high dose statin now,  she is going to start aspirin, we will recheck labs next visit  Depression/GAD: she states depression isno longer under control, she was on  Lexapro for many years, unable to take Cymbalta we switched to Citalopram and did not help, she is now on Effexor since 02/2017, we will increase dose today   She states she has lack of motivation, feels tired, she has been struggling since nephew died recently and is feeling worse. She states depression is much worse since. Denies suicidal thoughts or ideation   FMS: she feels better during Summer months,  Also better since losing weight and avoiding starches  She states her body pain is about0/10 - because she is at rest. . She is still walking Advised Tylenol for knee pain, she does not want to take Lyrica or Gabapentin.   Obesity: We gave her a rx of Vyvanse back in 10/2015 for binge eating disorder, but she was afraid to fill it. She continues to have problems not over eating and feels guilty. She started medication April 2018at a weight of 252 lbs.She likes to eat food - not necessarily desserts. She never tried laxatives or throwing up to help with weight loss. She has lost 32  lbs since started on medication and BMI is now below 40.  She is avoiding  potatoes, rice, bread and pasta, and is doing much better in terms of FMS and aches and pains  Metabolic Syndrome: she has polyphagia, no polydipsia or polyuria. She had elevated fasting insulin, she is doing well with life  style modification and we will recheck labs today   Centrolobular emphysema: she has mild dry cough in am's, but no SOB or wheezing.She states worse when laying down , she does not want to use an inhaler, unchanged   Atherosclerosis , aorta : discussed results from CT chest done,she is on high dose Crestor andaspirin, seen by cardiologist and given reassurance. She also has significant family history of heart disease. LDL goal is below 70   Lung nodule: previous smoker, she went back in Dec, stable in size and repeat in one year. Ordered by Dr. Grayland Ormond. Unchanged  AR: she states she is now on loratadine only, off xyzal and singulair because it stopped working.  She states worse with strong scents.    Patient Active Problem List   Diagnosis Date Noted  . Coronary artery calcification 09/06/2016  . Lung nodule, solitary 07/30/2016  . Atherosclerosis of aorta ([place]) 07/30/2016  . Centrilobular emphysema ([place]) 07/30/2016  . Personal history of tobacco use, presenting hazards to health 07/30/2016  . Binge eating disorder 11/10/2015  . Hyperglycemia 07/09/2015  . Radiculitis of left cervical region 10/28/2014  . Vitamin D deficiency 10/26/2014  . Lumbosacral spondylosis without myelopathy 10/26/2014  . Extreme obesity 10/26/2014  . De Quervain's disease (radial styloid tenosynovitis) 10/26/2014  . Nephrolithiasis 10/23/2014  . Trigger finger, right 10/23/2014  . Carpal tunnel syndrome 10/23/2014  .  Generalized anxiety disorder 10/23/2014  . Major depression ([place]) 10/23/2014  . Fibromyalgia 10/23/2014  . Perennial allergic rhinitis 10/23/2014  . Dyslipidemia 10/23/2014  . Benign hypertension 10/23/2014  . Morbid drug-induced obesity 10/23/2014  . Insomnia 10/23/2014  . Metabolic syndrome 09/32/3557  . Tobacco use disorder 10/23/2014  . Menopause syndrome 10/23/2014  . Lumbago 10/23/2014  . Osteoarthritis 10/23/2014  . Atrophic vaginitis 10/23/2014    Past Surgical History:   Procedure Laterality Date  . ABDOMINAL HYSTERECTOMY Bilateral 1988  . KNEE SURGERY Left 2014    Family History  Problem Relation Age of Onset  . Osteoarthritis Mother   . Stroke Mother   . Heart disease Father   . Peripheral Artery Disease Sister   . Diabetes Brother   . Diabetes Maternal Grandfather   . Heart attack Paternal Uncle   . Heart attack Paternal Uncle   . Heart attack Paternal Uncle   . Heart attack Paternal Uncle   . Heart attack Paternal Uncle   . Heart attack Paternal Uncle   . Heart attack Paternal Uncle   . Heart attack Brother   . Heart Problems Brother   . Heart attack Brother   . Stroke Brother   . Heart disease Brother   . Heart disease Other     Social History   Socioeconomic History  . Marital status: Married    Spouse name: Not on file  . Number of children: Not on file  . Years of education: Not on file  . Highest education level: Not on file  Occupational History  . Not on file  Social Needs  . Financial resource strain: Not on file  . Food insecurity:    Worry: Not on file    Inability: Not on file  . Transportation needs:    Medical: Not on file    Non-medical: Not on file  Tobacco Use  . Smoking status: Former Smoker    Packs/day: 1.00    Years: 50.00    Pack years: 50.00    Types: Cigarettes    Start date: 05/26/2016  . Smokeless tobacco: Never Used  Substance and Sexual Activity  . Alcohol use: Yes    Alcohol/week: 0.0 oz  . Drug use: No  . Sexual activity: Yes  Lifestyle  . Physical activity:    Days per week: Not on file    Minutes per session: Not on file  . Stress: Not on file  Relationships  . Social connections:    Talks on phone: Not on file    Gets together: Not on file    Attends religious service: Not on file    Active member of club or organization: Not on file    Attends meetings of clubs or organizations: Not on file    Relationship status: Not on file  . Intimate partner violence:    Fear of current  or ex partner: Not on file    Emotionally abused: Not on file    Physically abused: Not on file    Forced sexual activity: Not on file  Other Topics Concern  . Not on file  Social History Narrative  . Not on file     Current Outpatient Medications:  .  Acetaminophen-Aspirin Buffered 250-250 MG tablet, Take 1 tablet by mouth every 4 (four) hours as needed for fever., Disp: , Rfl:  .  ALPRAZolam (XANAX) 0.5 MG tablet, Take 1 tablet (0.5 mg total) by mouth as needed for anxiety., Disp: 30 tablet, Rfl: 0 .  aspirin EC 81 MG tablet, Take 1 tablet (81 mg total) by mouth daily., Disp: 30 tablet, Rfl: 0 .  calcium carbonate (TUMS - DOSED IN MG ELEMENTAL CALCIUM) 500 MG chewable tablet, Chew 1 tablet by mouth as needed for indigestion or heartburn., Disp: , Rfl:  .  Calcium Citrate-Vitamin D (CALCIUM + D PO), Take by mouth daily. , Disp: , Rfl:  .  lisdexamfetamine (VYVANSE) 40 MG capsule, Take 1 capsule (40 mg total) by mouth every morning., Disp: 30 capsule, Rfl: 0 .  lisdexamfetamine (VYVANSE) 40 MG capsule, Take 1 capsule (40 mg total) by mouth every morning., Disp: 30 capsule, Rfl: 0 .  lisdexamfetamine (VYVANSE) 40 MG capsule, Take 1 capsule (40 mg total) by mouth every morning., Disp: 30 capsule, Rfl: 0 .  magnesium gluconate (MAGONATE) 500 MG tablet, Take 500 mg by mouth daily., Disp: , Rfl:  .  Misc Natural Products (GLUCOSAMINE CHOND COMPLEX/MSM) TABS, Take by mouth daily., Disp: , Rfl:  .  olmesartan-hydrochlorothiazide (BENICAR HCT) 40-25 MG tablet, Take 1 tablet by mouth daily., Disp: 90 tablet, Rfl: 1 .  rosuvastatin (CRESTOR) 10 MG tablet, Take 1 tablet (10 mg total) by mouth daily., Disp: 90 tablet, Rfl: 1 .  Simethicone (GAS RELIEF PO), Take by mouth as needed., Disp: , Rfl:  .  venlafaxine XR (EFFEXOR-XR) 75 MG 24 hr capsule, Take 1 capsule (75 mg total) by mouth daily with breakfast., Disp: 90 capsule, Rfl: 1 .  Vitamin D, Cholecalciferol, 1000 units TABS, Take 1 tablet by mouth  daily., Disp: , Rfl:  .  VITAMIN E PO, Take by mouth daily., Disp: , Rfl:  .  loratadine (CLARITIN) 10 MG tablet, Take 1 tablet (10 mg total) by mouth daily., Disp: 30 tablet, Rfl: 11  No Known Allergies   ROS  Constitutional: Negative for fever, positive for  weight change.  Respiratory: Negative for cough and shortness of breath.   Cardiovascular: Negative for chest pain or palpitations.  Gastrointestinal: Negative for abdominal pain, no bowel changes.  Musculoskeletal: positive for gait problem - needs to stop because of chronic left knee pain, no swelling  Skin: Negative for rash.  Neurological: Negative for dizziness or headache.  No other specific complaints in a complete review of systems (except as listed in HPI above).  Objective  Vitals:   07/13/17 1158  BP: 118/62  Pulse: 100  Resp: 16  Temp: 98.6 F (37 C)  TempSrc: Oral  SpO2: 95%  Weight: 220 lb 4.8 oz (99.9 kg)  Height: 5\' 3"  (1.6 m)    Body mass index is 39.02 kg/m.  Physical Exam  Constitutional: Patient appears well-developed and well-nourished. Obese  No distress.  HEENT: head atraumatic, normocephalic, pupils equal and reactive to light, neck supple, throat within normal limits Cardiovascular: Normal rate, regular rhythm and normal heart sounds.  No murmur heard. No BLE edema. Pulmonary/Chest: Effort normal and breath sounds normal. No respiratory distress. Abdominal: Soft.  There is no tenderness. Psychiatric: Patient has a normal mood and affect. behavior is normal. Judgment and thought content normal.  PHQ2/9: Depression screen [facility] 2/9 07/13/2017 04/14/2017 03/16/2017 12/08/2016 09/06/2016  Decreased Interest 3 0 1 2 0  Down, Depressed, Hopeless 1 0 1 1 0  PHQ - 2 Score 4 0 2 3 0  Altered sleeping 3 0 0 1 -  Tired, decreased energy 3 0 1 1 -  Change in appetite 2 0 1 1 -  Feeling bad or failure about yourself  1 0 1  0 -  Trouble concentrating 3 0 1 1 -  Moving slowly or fidgety/restless 2 0 0  1 -  Suicidal thoughts 0 0 0 0 -  PHQ-9 Score 18 0 6 8 -  Difficult doing work/chores Very difficult Not difficult at all Somewhat difficult Somewhat difficult -   She just lost a nephew and is grieving, does not want to change medication   Fall Risk: Fall Risk  07/13/2017 07/13/2017 04/14/2017 04/14/2017 03/16/2017  Falls in the past year? No No No No No  Number falls in past yr: - - - - -  Injury with Fall? - - - - -  Comment - - - - -    Functional Status Survey: Is the patient deaf or have difficulty hearing?: No Does the patient have difficulty seeing, even when wearing glasses/contacts?: No Does the patient have difficulty concentrating, remembering, or making decisions?: No Does the patient have difficulty walking or climbing stairs?: No Does the patient have difficulty dressing or bathing?: No Does the patient have difficulty doing errands alone such as visiting a doctor's office or shopping?: No    Assessment & Plan  1. Atherosclerosis of aorta (HCC)  - rosuvastatin (CRESTOR) 10 MG tablet; Take 1 tablet (10 mg total) by mouth daily.  Dispense: 90 tablet; Refill: 1  2. Encounter for screening mammogram for breast cancer  - MM DIGITAL SCREENING BILATERAL; Future  3. Generalized anxiety disorder  - venlafaxine XR (EFFEXOR-XR) 150 MG 24 hr capsule; Take 1 capsule (150 mg total) by mouth daily with breakfast.  Dispense: 90 capsule; Refill: 1  4. Depression, major, recurrent, moderate  (HCC)  - venlafaxine XR (EFFEXOR-XR) 150 MG 24 hr capsule; Take 1 capsule (150 mg total) by mouth daily with breakfast.  Dispense: 90 capsule; Refill: 1  We will adjust dose today, return in 6 weeks to see how she is doing   5. Coronary artery calcification  - rosuvastatin (CRESTOR) 10 MG tablet; Take 1 tablet (10 mg total) by mouth daily.  Dispense: 90 tablet; Refill: 1  6. Morbid obesity, unspecified obesity type ([place])  Losing weight on medication   7. Metabolic syndrome  -  Hemoglobin A1c  8. Dyslipidemia  - Lipid panel  9. Benign hypertension  Olmesartan-hydrochlorothiazide (BENICAR HCT) 40-25 MG tablet; Take 1 tablet by mouth daily.  Dispense: 90 tablet; Refill: 1 - COMPLETE METABOLIC PANEL WITH GFR - CBC with Differential/Platelet  10. Binge eating disorder  - lisdexamfetamine (VYVANSE) 40 MG capsule; Take 1 capsule (40 mg total) by mouth every morning.  Dispense: 30 capsule; Refill: 0 - lisdexamfetamine (VYVANSE) 40 MG capsule; Take 1 capsule (40 mg total) by mouth every morning.  Dispense: 30 capsule; Refill: 0 - lisdexamfetamine (VYVANSE) 40 MG capsule; Take 1 capsule (40 mg total) by mouth every morning.  Dispense: 30 capsule; Refill: 0  11. Perennial allergic rhinitis with seasonal variation  Stopped xyzal and singulair because it was not working, taking otc loratadine   12. Hyperglycemia  - Hemoglobin A1c

## 2017-07-14 LAB — COMPLETE METABOLIC PANEL WITH GFR
AG RATIO: 1.9 (calc) (ref 1.0–2.5)
ALT: 16 U/L (ref 6–29)
AST: 17 U/L (ref 10–35)
Albumin: 4.3 g/dL (ref 3.6–5.1)
Alkaline phosphatase (APISO): 100 U/L (ref 33–130)
BILIRUBIN TOTAL: 0.5 mg/dL (ref 0.2–1.2)
BUN: 25 mg/dL (ref 7–25)
CALCIUM: 9.7 mg/dL (ref 8.6–10.4)
CHLORIDE: 104 mmol/L (ref 98–110)
CO2: 23 mmol/L (ref 20–32)
Creat: 0.63 mg/dL (ref 0.50–0.99)
GFR, EST NON AFRICAN AMERICAN: 93 mL/min/{1.73_m2} (ref >=60)
GFR, Est African American: 108 mL/min/{1.73_m2} (ref >=60)
GLUCOSE: 96 mg/dL (ref 65–139)
Globulin: 2.3 g/dL (calc) (ref 1.9–3.7)
POTASSIUM: 4 mmol/L (ref 3.5–5.3)
Sodium: 136 mmol/L (ref 135–146)
Total Protein: 6.6 g/dL (ref 6.1–8.1)

## 2017-07-14 LAB — CBC WITH DIFFERENTIAL/PLATELET
BASOS ABS: 57 {cells}/uL (ref 0–200)
BASOS PCT: 0.7 %
EOS ABS: 259 {cells}/uL (ref 15–500)
Eosinophils Relative: 3.2 %
HCT: 41.5 % (ref 35.0–45.0)
Hemoglobin: 14.3 g/dL (ref 11.7–15.5)
Lymphs Abs: 2487 cells/uL (ref 850–3900)
MCH: 30.7 pg (ref 27.0–33.0)
MCHC: 34.5 g/dL (ref 32.0–36.0)
MCV: 89.1 fL (ref 80.0–100.0)
MONOS PCT: 8.8 %
MPV: 11 fL (ref 7.5–12.5)
Neutro Abs: 4585 cells/uL (ref 1500–7800)
Neutrophils Relative %: 56.6 %
PLATELETS: 305 10*3/uL (ref 140–400)
RBC: 4.66 10*6/uL (ref 3.80–5.10)
RDW: 12.2 % (ref 11.0–15.0)
TOTAL LYMPHOCYTE: 30.7 %
WBC: 8.1 10*3/uL (ref 3.8–10.8)
WBCMIX: 713 {cells}/uL (ref 200–950)

## 2017-07-14 LAB — LIPID PANEL
CHOL/HDL RATIO: 3.1 (calc) (ref <5.0)
CHOLESTEROL: 159 mg/dL (ref <200)
HDL: 52 mg/dL (ref >50)
LDL Cholesterol (Calc): 87 mg/dL (calc)
Non-HDL Cholesterol (Calc): 107 mg/dL (calc) (ref <130)
TRIGLYCERIDES: 107 mg/dL (ref <150)

## 2017-07-14 LAB — HEMOGLOBIN A1C
EAG (MMOL/L): 6.2 (calc)
Hgb A1c MFr Bld: 5.5 % of total Hgb (ref <5.7)
MEAN PLASMA GLUCOSE: 111 (calc)

## 2017-07-26 ENCOUNTER — Encounter: Payer: Self-pay | Admitting: Family Medicine

## 2017-09-02 ENCOUNTER — Ambulatory Visit: Payer: Federal, State, Local not specified - PPO | Admitting: Family Medicine

## 2017-10-10 ENCOUNTER — Other Ambulatory Visit: Payer: Self-pay | Admitting: Family Medicine

## 2017-10-10 DIAGNOSIS — F331 Major depressive disorder, recurrent, moderate: Secondary | ICD-10-CM

## 2017-10-10 DIAGNOSIS — F411 Generalized anxiety disorder: Secondary | ICD-10-CM

## 2017-10-18 ENCOUNTER — Ambulatory Visit: Payer: Federal, State, Local not specified - PPO | Admitting: Family Medicine

## 2017-12-30 ENCOUNTER — Telehealth: Payer: Self-pay | Admitting: *Deleted

## 2017-12-30 DIAGNOSIS — Z122 Encounter for screening for malignant neoplasm of respiratory organs: Secondary | ICD-10-CM

## 2017-12-30 DIAGNOSIS — Z87891 Personal history of nicotine dependence: Secondary | ICD-10-CM

## 2017-12-30 NOTE — Telephone Encounter (Signed)
Patient has been notified that annual lung cancer screening low dose CT scan is due currently or will be in near future. Confirmed that patient is within the age range of 55-77, and asymptomatic, (no signs or symptoms of lung cancer). Patient denies illness that would prevent curative treatment for lung cancer if found. Verified smoking history, (current, 50.75 pack year). The shared decision making visit was done 07/28/16. Patient is agreeable for CT scan being scheduled.

## 2018-01-03 ENCOUNTER — Encounter: Payer: Self-pay | Admitting: Family Medicine

## 2018-01-03 ENCOUNTER — Ambulatory Visit: Payer: Federal, State, Local not specified - PPO | Admitting: Family Medicine

## 2018-01-03 VITALS — BP 110/68 | HR 91 | Temp 97.8°F | Resp 16 | Ht 63.0 in | Wt 221.5 lb

## 2018-01-03 DIAGNOSIS — I10 Essential (primary) hypertension: Secondary | ICD-10-CM

## 2018-01-03 DIAGNOSIS — Z23 Encounter for immunization: Secondary | ICD-10-CM

## 2018-01-03 DIAGNOSIS — I700 Atherosclerosis of aorta: Secondary | ICD-10-CM

## 2018-01-03 DIAGNOSIS — F5081 Binge eating disorder: Secondary | ICD-10-CM

## 2018-01-03 DIAGNOSIS — I2510 Atherosclerotic heart disease of native coronary artery without angina pectoris: Secondary | ICD-10-CM | POA: Diagnosis not present

## 2018-01-03 DIAGNOSIS — I2584 Coronary atherosclerosis due to calcified coronary lesion: Secondary | ICD-10-CM

## 2018-01-03 DIAGNOSIS — E785 Hyperlipidemia, unspecified: Secondary | ICD-10-CM

## 2018-01-03 DIAGNOSIS — F330 Major depressive disorder, recurrent, mild: Secondary | ICD-10-CM

## 2018-01-03 DIAGNOSIS — E8881 Metabolic syndrome: Secondary | ICD-10-CM

## 2018-01-03 MED ORDER — OLMESARTAN MEDOXOMIL-HCTZ 40-25 MG PO TABS: 1.0000 | ORAL_TABLET | Freq: Every day | ORAL | 1 refills | Status: DC

## 2018-01-03 MED ORDER — ROSUVASTATIN CALCIUM 10 MG PO TABS: 10.0000 mg | ORAL_TABLET | Freq: Every day | ORAL | 1 refills | Status: DC

## 2018-01-03 MED ORDER — LISDEXAMFETAMINE DIMESYLATE 40 MG PO CAPS: 40.0000 mg | ORAL_CAPSULE | ORAL | 0 refills | Status: DC

## 2018-01-03 NOTE — Progress Notes (Signed)
Name: Veronica Mendez   MRN: [id number]    DOB: [birth year]   Date:01/03/2018       Progress Note  Subjective  Chief Complaint  Chief Complaint  Patient presents with  . Follow-up    6 mth f/u  . Anxiety  . Depression  . FMS  . Obesity  . Metabolic Syndrome  . Atherosclerosis  . Centrolobular emphysema  . Hyperlipidemia  . Lung Lesion  . Allergic Rhinitis   . Hypertension  . Medication Refill    HPI  HTN: bp is towards low end of normal but she denies dizziness. Advised her to continue Benicar HCTZ for bp and monitor for orthostatic changes. No chest pain or palpitation.  Hyperlipidemia: She was found to have calcification of coronary vessels found on CT chest,she is on high dose statin now,she is going to start aspirin.  Depression/GAD: she states depression isunder control, she was on  Lexapro for many years, unable to take Cymbaltawe switched to Citalopram and did not help, she is was  on Effexor since 02/2017 she had a relapse and may 2019 we increased dose, she started to feel better, started to do some meditation and establish boundaries and is feeling much better.  She is off medications, and is feeling better, able to get up , using make up, going for daily walks. She cut off some relationships that were not healthy to her.   FMS: she is eating healthier and states pain is much better. She does not want to take Lyrica or Gabapentin.   Obesity: We gave her a rx of Vyvanse back in 10/2015 for binge eating disorder, but she was afraid to fill it. She continues to have problems not over eating and feels guilty. She started medication April 2018at a weight of 252 lbs.She likes to eat food - not necessarily desserts. She never tried laxatives or throwing up to help with weight loss. She has lost32lbs since started on medication and BMI is now below 40. Since last visit weight has been unchanged. She is still eating healthy and avoiding carbohydrates, she is also  walking at the mall 5 times a week.   Metabolic Syndrome: she has polyphagia, no polydipsia or polyuria. She had elevated fasting insulin, she is doing well with life style modification. Unchanged   Centrolobular emphysema: she has mild dry cough in am's, but no SOB or wheezing.She states worse when laying down , she does not want to use an inhaler. Unchanged   Atherosclerosis , aorta : discussed results from CT chest done,she is on high dose Crestor andaspirin, seen by cardiologist and given reassurance. She also has significant family history of heart disease. LDL goal is below 70, unchanged    Lung nodule: previous smoker,she went back in Dec, stable in size and repeat in one year. Ordered by Dr. Grayland Ormond.She has a follow up next month for a repeat   AR: she states she is now on loratadine only and is doing well. No sneezing or nasal congestion.    Patient Active Problem List   Diagnosis Date Noted  . Coronary artery calcification 09/06/2016  . Lung nodule, solitary 07/30/2016  . Atherosclerosis of aorta ([place]) 07/30/2016  . Centrilobular emphysema ([place]) 07/30/2016  . Personal history of tobacco use, presenting hazards to health 07/30/2016  . Binge eating disorder 11/10/2015  . Hyperglycemia 07/09/2015  . Radiculitis of left cervical region 10/28/2014  . Vitamin D deficiency 10/26/2014  . Lumbosacral spondylosis without myelopathy 10/26/2014  . Extreme obesity  10/26/2014  . De Quervain's disease (radial styloid tenosynovitis) 10/26/2014  . Nephrolithiasis 10/23/2014  . Trigger finger, right 10/23/2014  . Carpal tunnel syndrome 10/23/2014  . Generalized anxiety disorder 10/23/2014  . Major depression ([place]) 10/23/2014  . Fibromyalgia 10/23/2014  . Perennial allergic rhinitis 10/23/2014  . Dyslipidemia 10/23/2014  . Benign hypertension 10/23/2014  . Morbid drug-induced obesity 10/23/2014  . Insomnia 10/23/2014  . Metabolic syndrome 36/64/4034  . Tobacco use disorder  10/23/2014  . Menopause syndrome 10/23/2014  . Lumbago 10/23/2014  . Osteoarthritis 10/23/2014  . Atrophic vaginitis 10/23/2014    Past Surgical History:  Procedure Laterality Date  . ABDOMINAL HYSTERECTOMY Bilateral 1988  . KNEE SURGERY Left 2014    Family History  Problem Relation Age of Onset  . Osteoarthritis Mother   . Stroke Mother   . Heart disease Father   . Peripheral Artery Disease Sister   . Diabetes Brother   . Diabetes Maternal Grandfather   . Heart attack Paternal Uncle   . Heart attack Paternal Uncle   . Heart attack Paternal Uncle   . Heart attack Paternal Uncle   . Heart attack Paternal Uncle   . Heart attack Paternal Uncle   . Heart attack Paternal Uncle   . Heart attack Brother   . Heart Problems Brother   . Heart attack Brother   . Stroke Brother   . Heart disease Brother   . Heart disease Other     Social History   Socioeconomic History  . Marital status: Married    Spouse name: [name]  . Number of children: 1  . Years of education: Not on file  . Highest education level: Some college, no degree  Occupational History  . Occupation: Retired  Scientific laboratory technician  . Financial resource strain: Somewhat hard  . Food insecurity:    Worry: Sometimes true    Inability: Sometimes true  . Transportation needs:    Medical: No    Non-medical: No  Tobacco Use  . Smoking status: Light Tobacco Smoker    Packs/day: 1.00    Years: 50.00    Pack years: 50.00    Types: Cigarettes    Start date: 05/26/2016  . Smokeless tobacco: Never Used  Substance and Sexual Activity  . Alcohol use: Not Currently    Alcohol/week: 0.0 standard drinks  . Drug use: No  . Sexual activity: Yes    Partners: Male  Lifestyle  . Physical activity:    Days per week: 5 days    Minutes per session: 30 min  . Stress: Not at all  Relationships  . Social connections:    Talks on phone: More than three times a week    Gets together: More than three times a week    Attends  religious service: More than 4 times per year    Active member of club or organization: No    Attends meetings of clubs or organizations: Never    Relationship status: Married  . Intimate partner violence:    Fear of current or ex partner: No    Emotionally abused: No    Physically abused: No    Forced sexual activity: No  Other Topics Concern  . Not on file  Social History Narrative  . Not on file     Current Outpatient Medications:  .  Acetaminophen-Aspirin Buffered 250-250 MG tablet, Take 1 tablet by mouth every 4 (four) hours as needed for fever., Disp: , Rfl:  .  ALPRAZolam (XANAX) 0.5 MG  tablet, Take 1 tablet (0.5 mg total) by mouth as needed for anxiety., Disp: 30 tablet, Rfl: 0 .  aspirin EC 81 MG tablet, Take 1 tablet (81 mg total) by mouth daily., Disp: 30 tablet, Rfl: 0 .  calcium carbonate (TUMS - DOSED IN MG ELEMENTAL CALCIUM) 500 MG chewable tablet, Chew 1 tablet by mouth as needed for indigestion or heartburn., Disp: , Rfl:  .  Calcium Citrate-Vitamin D (CALCIUM + D PO), Take by mouth daily. , Disp: , Rfl:  .  lisdexamfetamine (VYVANSE) 40 MG capsule, Take 1 capsule (40 mg total) by mouth every morning., Disp: 30 capsule, Rfl: 0 .  lisdexamfetamine (VYVANSE) 40 MG capsule, Take 1 capsule (40 mg total) by mouth every morning., Disp: 30 capsule, Rfl: 0 .  lisdexamfetamine (VYVANSE) 40 MG capsule, Take 1 capsule (40 mg total) by mouth every morning., Disp: 30 capsule, Rfl: 0 .  loratadine (CLARITIN) 10 MG tablet, Take 1 tablet (10 mg total) by mouth daily., Disp: 30 tablet, Rfl: 11 .  magnesium gluconate (MAGONATE) 500 MG tablet, Take 500 mg by mouth daily., Disp: , Rfl:  .  Misc Natural Products (GLUCOSAMINE CHOND COMPLEX/MSM) TABS, Take by mouth daily., Disp: , Rfl:  .  olmesartan-hydrochlorothiazide (BENICAR HCT) 40-25 MG tablet, Take 1 tablet by mouth daily., Disp: 90 tablet, Rfl: 1 .  rosuvastatin (CRESTOR) 10 MG tablet, Take 1 tablet (10 mg total) by mouth daily., Disp:  90 tablet, Rfl: 1 .  Simethicone (GAS RELIEF PO), Take by mouth as needed., Disp: , Rfl:  .  venlafaxine XR (EFFEXOR-XR) 150 MG 24 hr capsule, TAKE 1 CAPSULE (150 MG TOTAL) BY MOUTH DAILY WITH BREAKFAST. NEW DOSE, Disp: 90 capsule, Rfl: 0 .  Vitamin D, Cholecalciferol, 1000 units TABS, Take 1 tablet by mouth daily., Disp: , Rfl:  .  VITAMIN E PO, Take by mouth daily., Disp: , Rfl:   No Known Allergies  I personally reviewed active problem list, medication list, allergies, family history, social history with the patient/caregiver today.   ROS  Constitutional: Negative for fever or weight change.  Respiratory: Negative for cough and shortness of breath.   Cardiovascular: Negative for chest pain or palpitations.  Gastrointestinal: Negative for abdominal pain, no bowel changes.  Musculoskeletal: Negative for gait problem or joint swelling.  Skin: Negative for rash.  Neurological: Negative for dizziness or headache.  No other specific complaints in a complete review of systems (except as listed in HPI above).  Objective  Vitals:   01/03/18 1354  BP: 110/68  Pulse: 91  Resp: 16  Temp: 97.8 F (36.6 C)  TempSrc: Oral  SpO2: 99%  Weight: 221 lb 8 oz (100.5 kg)  Height: 5\' 3"  (1.6 m)    Body mass index is 39.24 kg/m.  Physical Exam  Constitutional: Patient appears well-developed and well-nourished. Obese  No distress.  HEENT: head atraumatic, normocephalic, pupils equal and reactive to light, neck supple, throat within normal limits Cardiovascular: Normal rate, regular rhythm and normal heart sounds.  No murmur heard. No BLE edema. Pulmonary/Chest: Effort normal and breath sounds normal. No respiratory distress. Abdominal: Soft.  There is no tenderness. Psychiatric: Patient has a normal mood and affect. behavior is normal. Judgment and thought content normal.  PHQ2/9: Depression screen [facility] 2/9 01/03/2018 07/13/2017 04/14/2017 03/16/2017 12/08/2016  Decreased Interest 0 3 0 1 2   Down, Depressed, Hopeless 0 1 0 1 1  PHQ - 2 Score 0 4 0 2 3  Altered sleeping 0 3 0 0  1  Tired, decreased energy 0 3 0 1 1  Change in appetite 0 2 0 1 1  Feeling bad or failure about yourself  0 1 0 1 0  Trouble concentrating 0 3 0 1 1  Moving slowly or fidgety/restless 0 2 0 0 1  Suicidal thoughts 0 0 0 0 0  PHQ-9 Score 0 18 0 6 8  Difficult doing work/chores Not difficult at all Very difficult Not difficult at all Somewhat difficult Somewhat difficult     Fall Risk: Fall Risk  01/03/2018 07/13/2017 07/13/2017 04/14/2017 04/14/2017  Falls in the past year? 0 No No No No  Number falls in past yr: - - - - -  Injury with Fall? - - - - -  Comment - - - - -     Functional Status Survey: Is the patient deaf or have difficulty hearing?: No Does the patient have difficulty seeing, even when wearing glasses/contacts?: No Does the patient have difficulty concentrating, remembering, or making decisions?: No Does the patient have difficulty walking or climbing stairs?: No Does the patient have difficulty dressing or bathing?: No Does the patient have difficulty doing errands alone such as visiting a doctor's office or shopping?: No    Assessment & Plan  1. Benign hypertension  - olmesartan-hydrochlorothiazide (BENICAR HCT) 40-25 MG tablet; Take 1 tablet by mouth daily.  Dispense: 90 tablet; Refill: 1  2. Need for influenza vaccination  - Flu vaccine HIGH DOSE PF  3. Atherosclerosis of aorta (HCC)  - rosuvastatin (CRESTOR) 10 MG tablet; Take 1 tablet (10 mg total) by mouth daily.  Dispense: 90 tablet; Refill: 1  4. Coronary artery calcification  - rosuvastatin (CRESTOR) 10 MG tablet; Take 1 tablet (10 mg total) by mouth daily.  Dispense: 90 tablet; Refill: 1  5. Binge eating disorder  - lisdexamfetamine (VYVANSE) 40 MG capsule; Take 1 capsule (40 mg total) by mouth every morning.  Dispense: 30 capsule; Refill: 0 - lisdexamfetamine (VYVANSE) 40 MG capsule; Take 1 capsule (40  mg total) by mouth every morning.  Dispense: 30 capsule; Refill: 0 - lisdexamfetamine (VYVANSE) 40 MG capsule; Take 1 capsule (40 mg total) by mouth every morning.  Dispense: 30 capsule; Refill: 0  6. Dyslipidemia   7. Metabolic syndrome   8. Depression, major, recurrent, mild ([place])

## 2018-01-15 ENCOUNTER — Other Ambulatory Visit: Payer: Self-pay | Admitting: Family Medicine

## 2018-01-15 DIAGNOSIS — F331 Major depressive disorder, recurrent, moderate: Secondary | ICD-10-CM

## 2018-01-15 DIAGNOSIS — F411 Generalized anxiety disorder: Secondary | ICD-10-CM

## 2018-01-30 ENCOUNTER — Ambulatory Visit
Admission: RE | Admit: 2018-01-30 | Discharge: 2018-01-30 | Disposition: A | Discharge status: Home / Self Care | Payer: Federal, State, Local not specified - PPO | Source: Ambulatory Visit | Attending: Oncology | Admitting: Oncology

## 2018-01-30 DIAGNOSIS — Z122 Encounter for screening for malignant neoplasm of respiratory organs: Secondary | ICD-10-CM | POA: Insufficient documentation

## 2018-01-30 DIAGNOSIS — Z87891 Personal history of nicotine dependence: Secondary | ICD-10-CM | POA: Diagnosis present

## 2018-01-31 ENCOUNTER — Encounter: Payer: Self-pay | Admitting: *Deleted

## 2018-05-05 ENCOUNTER — Ambulatory Visit: Payer: Federal, State, Local not specified - PPO | Admitting: Family Medicine

## 2018-05-16 ENCOUNTER — Telehealth: Payer: Self-pay | Admitting: Family Medicine

## 2018-05-16 NOTE — Telephone Encounter (Signed)
Tried to convince pt to do an virtual appt. Informed her about the webex and she stated that she is not good at the computer. She also stated that she is not around ppl that work. She will call back if she really need the appt.

## 2018-05-16 NOTE — Telephone Encounter (Signed)
Copied from [place] [id number]. Topic: Quick Communication - See Telephone Encounter >> May 16, 2018 10:46 AM Sheran Luz wrote: CRM for notification. See Telephone encounter for: 05/16/18.   Patient calling to request prescription for prednisone sent into pharmacy, as she has a vertebrae out of place in her neck. She states that this has happened before on numerous occasions that PCP is aware of and has already set up appointment with Dr. Freddi Che, [place].  Pharmacy:[id number] - [place], [place] - [address]  [phone] (Phone) [phone] (Fax)

## 2018-05-16 NOTE — Telephone Encounter (Signed)
Melissa spoke with patient and she is to call back.

## 2018-06-28 ENCOUNTER — Other Ambulatory Visit: Payer: Self-pay

## 2018-06-28 ENCOUNTER — Encounter: Payer: Self-pay | Admitting: Family Medicine

## 2018-06-28 ENCOUNTER — Ambulatory Visit (INDEPENDENT_AMBULATORY_CARE_PROVIDER_SITE_OTHER): Payer: Federal, State, Local not specified - PPO | Admitting: Family Medicine

## 2018-06-28 DIAGNOSIS — I2510 Atherosclerotic heart disease of native coronary artery without angina pectoris: Secondary | ICD-10-CM | POA: Diagnosis not present

## 2018-06-28 DIAGNOSIS — F411 Generalized anxiety disorder: Secondary | ICD-10-CM

## 2018-06-28 DIAGNOSIS — I10 Essential (primary) hypertension: Secondary | ICD-10-CM

## 2018-06-28 DIAGNOSIS — E8881 Metabolic syndrome: Secondary | ICD-10-CM

## 2018-06-28 DIAGNOSIS — F325 Major depressive disorder, single episode, in full remission: Secondary | ICD-10-CM

## 2018-06-28 DIAGNOSIS — E785 Hyperlipidemia, unspecified: Secondary | ICD-10-CM

## 2018-06-28 DIAGNOSIS — J432 Centrilobular emphysema: Secondary | ICD-10-CM

## 2018-06-28 DIAGNOSIS — I700 Atherosclerosis of aorta: Secondary | ICD-10-CM

## 2018-06-28 DIAGNOSIS — F330 Major depressive disorder, recurrent, mild: Secondary | ICD-10-CM | POA: Insufficient documentation

## 2018-06-28 DIAGNOSIS — I2584 Coronary atherosclerosis due to calcified coronary lesion: Secondary | ICD-10-CM

## 2018-06-28 MED ORDER — OLMESARTAN MEDOXOMIL-HCTZ 40-25 MG PO TABS: 1.0000 | ORAL_TABLET | Freq: Every day | ORAL | 1 refills | Status: DC

## 2018-06-28 MED ORDER — ROSUVASTATIN CALCIUM 10 MG PO TABS: 10.0000 mg | ORAL_TABLET | Freq: Every day | ORAL | 1 refills | Status: DC

## 2018-06-28 NOTE — Progress Notes (Signed)
Name: Veronica Mendez   MRN: [id number]    DOB: [birth year]   Date:06/28/2018       Progress Note  Subjective  Chief Complaint  Chief Complaint  Patient presents with  . Depression  . Hypertension  . Anxiety    I connected with  Celesta Aver  on 06/28/18 at 11:00 AM EDT by a video enabled telemedicine application and verified that I am speaking with the correct person using two identifiers.  I discussed the limitations of evaluation and management by telemedicine and the availability of in person appointments. The patient expressed understanding and agreed to proceed. Staff also discussed with the patient that there may be a patient responsible charge related to this service. Patient Location: at home  Provider Location: [facility]   HPI  HTN: bp at goal at home. Advised her to continue Benicar HCTZ for bp. No chest pain or palpitation.  Hyperlipidemia: She was found to have calcification of coronary vessels found on CT chest,she is on high dose statin now,she is going to start aspirin.  Depression/GAD: she states depression isunder control, shewas onLexapro for many years, unable to take Cymbaltawe switched to Citalopram and did not help, sheis was  on Effexor since 02/2017 she had a relapse and may 2019 we increased dose, she started to feel better, started to do some meditation and establish boundaries and is feeling much better. She is off medications, and is feeling better, able to get up , using make up, going for daily walks. She cut off some relationships that were not healthy to her. Doing well in remission   FMS: she is eating healthier and states pain is much better. She does not want to take Lyrica or Gabapentin, going to chiropractor for neck pain and is doing very well at this time.   Obesity:  She started Vyvanse back in  April 2018at a weight of 252 lbs.She likes to eat food - not necessarily desserts. She never tried laxatives or  throwing up to help with weight loss. She had lost32lbs since started on medicationand BMI was below 40 , she stopped vyvanse gained some weight but is walking daily and eating healthy.   Metabolic Syndrome: she has polyphagia, no polydipsia or polyuria. She had elevated fasting insulin, she is doing well with life style modification. We recheck labs prior to her next visit  Centrolobular emphysema: she has mild dry cough in am's, but no SOB or wheezing.She states worse when laying down , she does not want to use an inhaler. Unchanged  Atherosclerosis , aorta : discussed results from CT chest done,she is on high dose Crestor andaspirin, seen by cardiologist and given reassurance. She also has significant family history of heart disease. LDL goal is below 70, recheck labs  Lung nodule: previous smoker,she went back in Dec, stable in size and repeat in one year. Ordered by Dr. Grayland Ormond. Reviewed labs from last visit   AR: she states she isnow on loratadine only and is doing well. No sneezing or nasal congestion. Stable  Patient Active Problem List   Diagnosis Date Noted  . Morbid obesity, unspecified obesity type ([place]) 06/28/2018  . Depression, major, recurrent, mild ([place]) 06/28/2018  . Coronary artery calcification 09/06/2016  . Lung nodule, solitary 07/30/2016  . Atherosclerosis of aorta ([place]) 07/30/2016  . Centrilobular emphysema ([place]) 07/30/2016  . Personal history of tobacco use, presenting hazards to health 07/30/2016  . Binge eating disorder 11/10/2015  . Hyperglycemia 07/09/2015  .  Radiculitis of left cervical region 10/28/2014  . Vitamin D deficiency 10/26/2014  . Lumbosacral spondylosis without myelopathy 10/26/2014  . Extreme obesity 10/26/2014  . De Quervain's disease (radial styloid tenosynovitis) 10/26/2014  . Nephrolithiasis 10/23/2014  . Trigger finger, right 10/23/2014  . Carpal tunnel syndrome 10/23/2014  . Generalized anxiety disorder 10/23/2014  . Major  depression ([place]) 10/23/2014  . Fibromyalgia 10/23/2014  . Perennial allergic rhinitis 10/23/2014  . Dyslipidemia 10/23/2014  . Benign hypertension 10/23/2014  . Morbid drug-induced obesity 10/23/2014  . Insomnia 10/23/2014  . Metabolic syndrome 45/80/9983  . Tobacco use disorder 10/23/2014  . Menopause syndrome 10/23/2014  . Lumbago 10/23/2014  . Osteoarthritis 10/23/2014  . Atrophic vaginitis 10/23/2014    Past Surgical History:  Procedure Laterality Date  . ABDOMINAL HYSTERECTOMY Bilateral 1988  . KNEE SURGERY Left 2014    Family History  Problem Relation Age of Onset  . Osteoarthritis Mother   . Stroke Mother   . Heart disease Father   . Peripheral Artery Disease Sister   . Diabetes Brother   . Diabetes Maternal Grandfather   . Heart attack Paternal Uncle   . Heart attack Paternal Uncle   . Heart attack Paternal Uncle   . Heart attack Paternal Uncle   . Heart attack Paternal Uncle   . Heart attack Paternal Uncle   . Heart attack Paternal Uncle   . Heart attack Brother   . Heart Problems Brother   . Heart attack Brother   . Stroke Brother   . Heart disease Brother   . Heart disease Other     Social History   Socioeconomic History  . Marital status: Married    Spouse name: [name]  . Number of children: 1  . Years of education: Not on file  . Highest education level: Some college, no degree  Occupational History  . Occupation: Retired  Scientific laboratory technician  . Financial resource strain: Somewhat hard  . Food insecurity:    Worry: Sometimes true    Inability: Sometimes true  . Transportation needs:    Medical: No    Non-medical: No  Tobacco Use  . Smoking status: Light Tobacco Smoker    Packs/day: 1.00    Years: 50.00    Pack years: 50.00    Types: Cigarettes    Start date: 05/26/2016  . Smokeless tobacco: Never Used  Substance and Sexual Activity  . Alcohol use: Not Currently    Alcohol/week: 0.0 standard drinks  . Drug use: No  . Sexual activity: Yes     Partners: Male  Lifestyle  . Physical activity:    Days per week: 5 days    Minutes per session: 30 min  . Stress: Not at all  Relationships  . Social connections:    Talks on phone: More than three times a week    Gets together: More than three times a week    Attends religious service: More than 4 times per year    Active member of club or organization: No    Attends meetings of clubs or organizations: Never    Relationship status: Married  . Intimate partner violence:    Fear of current or ex partner: No    Emotionally abused: No    Physically abused: No    Forced sexual activity: No  Other Topics Concern  . Not on file  Social History Narrative  . Not on file     Current Outpatient Medications:  .  ALPRAZolam (XANAX) 0.5 MG tablet,  Take 1 tablet (0.5 mg total) by mouth as needed for anxiety., Disp: 30 tablet, Rfl: 0 .  aspirin EC 81 MG tablet, Take 1 tablet (81 mg total) by mouth daily., Disp: 30 tablet, Rfl: 0 .  calcium carbonate (TUMS - DOSED IN MG ELEMENTAL CALCIUM) 500 MG chewable tablet, Chew 1 tablet by mouth as needed for indigestion or heartburn., Disp: , Rfl:  .  Calcium Citrate-Vitamin D (CALCIUM + D PO), Take by mouth daily. , Disp: , Rfl:  .  loratadine (CLARITIN) 10 MG tablet, Take 1 tablet (10 mg total) by mouth daily., Disp: 30 tablet, Rfl: 11 .  magnesium gluconate (MAGONATE) 500 MG tablet, Take 500 mg by mouth daily., Disp: , Rfl:  .  Misc Natural Products (GLUCOSAMINE CHOND COMPLEX/MSM) TABS, Take by mouth daily., Disp: , Rfl:  .  olmesartan-hydrochlorothiazide (BENICAR HCT) 40-25 MG tablet, Take 1 tablet by mouth daily., Disp: 90 tablet, Rfl: 1 .  rosuvastatin (CRESTOR) 10 MG tablet, Take 1 tablet (10 mg total) by mouth daily., Disp: 90 tablet, Rfl: 1 .  Simethicone (GAS RELIEF PO), Take by mouth as needed., Disp: , Rfl:  .  Vitamin D, Cholecalciferol, 1000 units TABS, Take 1 tablet by mouth daily., Disp: , Rfl:  .  VITAMIN E PO, Take by mouth daily.,  Disp: , Rfl:   No Known Allergies  I personally reviewed active problem list, medication list, allergies, family history, social history with the patient/caregiver today.   ROS  Ten systems reviewed and is negative except as mentioned in HPI   Objective  Virtual encounter, vitals obtained at home  Body mass index is 40.21 kg/m.  Physical Exam  Awake, alert and oriented  PHQ2/9: Depression screen [facility] 2/9 06/28/2018 01/03/2018 07/13/2017 04/14/2017 03/16/2017  Decreased Interest 0 0 3 0 1  Down, Depressed, Hopeless 0 0 1 0 1  PHQ - 2 Score 0 0 4 0 2  Altered sleeping 0 0 3 0 0  Tired, decreased energy 0 0 3 0 1  Change in appetite 1 0 2 0 1  Feeling bad or failure about yourself  0 0 1 0 1  Trouble concentrating 0 0 3 0 1  Moving slowly or fidgety/restless 0 0 2 0 0  Suicidal thoughts 0 0 0 0 0  PHQ-9 Score 1 0 18 0 6  Difficult doing work/chores Not difficult at all Not difficult at all Very difficult Not difficult at all Somewhat difficult   PHQ-2/9 Result is negative.    Fall Risk: Fall Risk  06/28/2018 01/03/2018 07/13/2017 07/13/2017 04/14/2017  Falls in the past year? 0 0 No No No  Number falls in past yr: 0 - - - -  Injury with Fall? 0 - - - -  Comment - - - - -     Assessment & Plan  1. Benign hypertension  - olmesartan-hydrochlorothiazide (BENICAR HCT) 40-25 MG tablet; Take 1 tablet by mouth daily.  Dispense: 90 tablet; Refill: 1  2. Atherosclerosis of aorta (HCC)  - rosuvastatin (CRESTOR) 10 MG tablet; Take 1 tablet (10 mg total) by mouth daily.  Dispense: 90 tablet; Refill: 1  3. Coronary artery calcification  - rosuvastatin (CRESTOR) 10 MG tablet; Take 1 tablet (10 mg total) by mouth daily.  Dispense: 90 tablet; Refill: 1  1. Benign hypertension  - olmesartan-hydrochlorothiazide (BENICAR HCT) 40-25 MG tablet; Take 1 tablet by mouth daily.  Dispense: 90 tablet; Refill: 1  2. Atherosclerosis of aorta (HCC)  - rosuvastatin (CRESTOR) 10  MG tablet;  Take 1 tablet (10 mg total) by mouth daily.  Dispense: 90 tablet; Refill: 1  3. Coronary artery calcification  - rosuvastatin (CRESTOR) 10 MG tablet; Take 1 tablet (10 mg total) by mouth daily.  Dispense: 90 tablet; Refill: 1  4. Morbid obesity, unspecified obesity type [facility])  Discussed with the patient the risk posed by an increased BMI. Discussed importance of portion control, calorie counting and at least 150 minutes of physical activity weekly. Avoid sweet beverages and drink more water. Eat at least 6 servings of fruit and vegetables daily   5. Centrilobular emphysema (HCC)  Not on medication   6. Depression, major, in remission ([place])   7. Dyslipidemia  On statin therapy   8. Metabolic syndrome  Doing well with life style modification   9. Generalized anxiety disorder  Doing well with mindfulness and physical activity  I discussed the assessment and treatment plan with the patient. The patient was provided an opportunity to ask questions and all were answered. The patient agreed with the plan and demonstrated an understanding of the instructions.  The patient was advised to call back or seek an in-person evaluation if the symptoms worsen or if the condition fails to improve as anticipated.  I provided 25 minutes of non-face-to-face time during this encounter.

## 2018-08-24 LAB — CBC WITH DIFFERENTIAL/PLATELET
Absolute Monocytes: 713 cells/uL (ref 200–950)
Basophils Absolute: 82 cells/uL (ref 0–200)
Basophils Relative: 1 %
Eosinophils Absolute: 295 cells/uL (ref 15–500)
Eosinophils Relative: 3.6 %
HCT: 43.8 % (ref 35.0–45.0)
Hemoglobin: 14.8 g/dL (ref 11.7–15.5)
Lymphs Abs: 2247 cells/uL (ref 850–3900)
MCH: 31 pg (ref 27.0–33.0)
MCHC: 33.8 g/dL (ref 32.0–36.0)
MCV: 91.6 fL (ref 80.0–100.0)
MPV: 11.2 fL (ref 7.5–12.5)
Monocytes Relative: 8.7 %
Neutro Abs: 4863 cells/uL (ref 1500–7800)
Neutrophils Relative %: 59.3 %
Platelets: 304 10*3/uL (ref 140–400)
RBC: 4.78 10*6/uL (ref 3.80–5.10)
RDW: 12.3 % (ref 11.0–15.0)
Total Lymphocyte: 27.4 %
WBC: 8.2 10*3/uL (ref 3.8–10.8)

## 2018-08-24 LAB — COMPLETE METABOLIC PANEL WITH GFR
AG Ratio: 1.8 (calc) (ref 1.0–2.5)
ALT: 19 U/L (ref 6–29)
AST: 17 U/L (ref 10–35)
Albumin: 4.4 g/dL (ref 3.6–5.1)
Alkaline phosphatase (APISO): 107 U/L (ref 37–153)
BUN: 21 mg/dL (ref 7–25)
CO2: 24 mmol/L (ref 20–32)
Calcium: 9.8 mg/dL (ref 8.6–10.4)
Chloride: 104 mmol/L (ref 98–110)
Creat: 0.58 mg/dL (ref 0.50–0.99)
GFR, Est African American: 110 mL/min/{1.73_m2} (ref >=60)
GFR, Est Non African American: 95 mL/min/{1.73_m2} (ref >=60)
Globulin: 2.4 g/dL (calc) (ref 1.9–3.7)
Glucose, Bld: 105 mg/dL — ABNORMAL HIGH (ref 65–99)
Potassium: 4.1 mmol/L (ref 3.5–5.3)
Sodium: 137 mmol/L (ref 135–146)
Total Bilirubin: 0.4 mg/dL (ref 0.2–1.2)
Total Protein: 6.8 g/dL (ref 6.1–8.1)

## 2018-08-24 LAB — HEMOGLOBIN A1C
Hgb A1c MFr Bld: 5.4 % of total Hgb (ref <5.7)
Mean Plasma Glucose: 108 (calc)
eAG (mmol/L): 6 (calc)

## 2018-08-24 LAB — LIPID PANEL
Cholesterol: 174 mg/dL (ref <200)
HDL: 64 mg/dL (ref >=50)
LDL Cholesterol (Calc): 91 mg/dL (calc)
Non-HDL Cholesterol (Calc): 110 mg/dL (calc) (ref <130)
Total CHOL/HDL Ratio: 2.7 (calc) (ref <5.0)
Triglycerides: 96 mg/dL (ref <150)

## 2018-10-11 ENCOUNTER — Other Ambulatory Visit: Payer: Self-pay | Admitting: Family Medicine

## 2018-10-11 DIAGNOSIS — I10 Essential (primary) hypertension: Secondary | ICD-10-CM

## 2018-11-22 ENCOUNTER — Other Ambulatory Visit: Payer: Self-pay

## 2018-11-22 ENCOUNTER — Ambulatory Visit (INDEPENDENT_AMBULATORY_CARE_PROVIDER_SITE_OTHER): Payer: Federal, State, Local not specified - PPO

## 2018-11-22 DIAGNOSIS — Z23 Encounter for immunization: Secondary | ICD-10-CM | POA: Diagnosis not present

## 2018-12-29 ENCOUNTER — Ambulatory Visit: Payer: Federal, State, Local not specified - PPO | Admitting: Family Medicine

## 2018-12-29 ENCOUNTER — Other Ambulatory Visit: Payer: Self-pay

## 2018-12-29 ENCOUNTER — Encounter: Payer: Self-pay | Admitting: Family Medicine

## 2018-12-29 VITALS — BP 122/72 | HR 103 | Temp 97.1°F | Resp 16 | Ht 63.0 in | Wt 237.6 lb

## 2018-12-29 DIAGNOSIS — I700 Atherosclerosis of aorta: Secondary | ICD-10-CM

## 2018-12-29 DIAGNOSIS — E785 Hyperlipidemia, unspecified: Secondary | ICD-10-CM

## 2018-12-29 DIAGNOSIS — F5081 Binge eating disorder: Secondary | ICD-10-CM

## 2018-12-29 DIAGNOSIS — I2584 Coronary atherosclerosis due to calcified coronary lesion: Secondary | ICD-10-CM

## 2018-12-29 DIAGNOSIS — I2510 Atherosclerotic heart disease of native coronary artery without angina pectoris: Secondary | ICD-10-CM | POA: Diagnosis not present

## 2018-12-29 DIAGNOSIS — J432 Centrilobular emphysema: Secondary | ICD-10-CM

## 2018-12-29 DIAGNOSIS — I10 Essential (primary) hypertension: Secondary | ICD-10-CM

## 2018-12-29 DIAGNOSIS — F411 Generalized anxiety disorder: Secondary | ICD-10-CM

## 2018-12-29 DIAGNOSIS — E8881 Metabolic syndrome: Secondary | ICD-10-CM

## 2018-12-29 MED ORDER — ROSUVASTATIN CALCIUM 20 MG PO TABS: 20.0000 mg | ORAL_TABLET | Freq: Every day | ORAL | 1 refills | Status: DC

## 2018-12-29 MED ORDER — OLMESARTAN MEDOXOMIL-HCTZ 40-25 MG PO TABS: 1.0000 | ORAL_TABLET | Freq: Every day | ORAL | 1 refills | Status: DC

## 2018-12-29 NOTE — Progress Notes (Signed)
Name: Veronica Mendez   MRN: [id number]    DOB: [birth year]   Date:12/29/2018       Progress Note  Subjective  Chief Complaint  Chief Complaint  Patient presents with  . Medication Refill    6 month F/U  . Morbid Obesity  . Hyperlipidemia  . Depression  . FMS  . Lung Lesion  . Centrolobular emphysema  . Atherosclerosis , aorta  . Allergic Rhinitis     HPI  HTN: bp is at goal.  Advised her tocontinue Benicar HCTZ for bp. No chest pain, dizziness  or palpitation.  Hyperlipidemia: She was found to have calcification of coronary vessels found on CT chest,she is on high dose statin now,last LDL below 100, she is going to start aspirin.  Depression/GAD: she states depression isunder control, shewas onLexapro for many years, unable to take Cymbaltawe switched to Citalopram and did not help, sheiswason Effexor since 02/2017 she had a relapse and may 2019 we increased dose, she started to feel better, started to do some meditation and establish boundaries and is feeling much better. She is off medications, and is feeling better, she states she retired and decided not to every ones therapist. She is doing better with personal boundaries   FMS: sheis eating healthier and states pain is much better. She does not want to take Lyrica or Gabapentin, going to chiropractor for neck pain and is doing very well at this time.   Obesity:  She started Vyvanse back in  April 2018at a weight of 252 lbs.She likes to eat food - not necessarily desserts. She never tried laxatives or throwing up to help with weight loss. She had lost32lbs since started on medicationand BMI was below 40 , she stopped Vyvanse and started to gradually gain weight again. She is trying to walk more often, she states COVID-19 has not helped because of social isolation  Metabolic Syndrome: she has polyphagia, no polydipsia or polyuria. She had elevated fasting insulin, she is doing well with life style  modification. Last A1C was back to normal   Centrolobular emphysema: she has mild dry cough in am's, but no SOB or wheezing.She states worse when laying down , she does not want to use an inhaler. She states she is doing well   Atherosclerosis , aorta : discussed results from CT chest done,she is on high dose Crestor andaspirin, seen by cardiologist and given reassurance. She also has significant family history of heart disease. LDL goal is below 70, last LDL was 91, she is wiling to try higher dose statin   Lung nodule: previous smoker,she went back in Dec, stable in size and repeat in one year. Ordered by Dr. Grayland Ormond. She is due for repeat in Dec 2020   AR: she states she stopped loratadine and stopped working, she is back on Sudafed , advised to try something else such as allegra or zyrtec  No sneezing or nasal congestion.  Patient Active Problem List   Diagnosis Date Noted  . Morbid obesity, unspecified obesity type ([place]) 06/28/2018  . Depression, major, recurrent, mild ([place]) 06/28/2018  . Coronary artery calcification 09/06/2016  . Lung nodule, solitary 07/30/2016  . Atherosclerosis of aorta ([place]) 07/30/2016  . Centrilobular emphysema ([place]) 07/30/2016  . Personal history of tobacco use, presenting hazards to health 07/30/2016  . Binge eating disorder 11/10/2015  . Hyperglycemia 07/09/2015  . Radiculitis of left cervical region 10/28/2014  . Vitamin D deficiency 10/26/2014  . Lumbosacral spondylosis without myelopathy 10/26/2014  .  Extreme obesity 10/26/2014  . De Quervain's disease (radial styloid tenosynovitis) 10/26/2014  . Nephrolithiasis 10/23/2014  . Trigger finger, right 10/23/2014  . Carpal tunnel syndrome 10/23/2014  . Generalized anxiety disorder 10/23/2014  . Major depression ([place]) 10/23/2014  . Fibromyalgia 10/23/2014  . Perennial allergic rhinitis 10/23/2014  . Dyslipidemia 10/23/2014  . Benign hypertension 10/23/2014  . Morbid drug-induced obesity  10/23/2014  . Insomnia 10/23/2014  . Metabolic syndrome 99991111  . Tobacco use disorder 10/23/2014  . Menopause syndrome 10/23/2014  . Lumbago 10/23/2014  . Osteoarthritis 10/23/2014  . Atrophic vaginitis 10/23/2014    Past Surgical History:  Procedure Laterality Date  . ABDOMINAL HYSTERECTOMY Bilateral 1988  . KNEE SURGERY Left 2014    Family History  Problem Relation Age of Onset  . Osteoarthritis Mother   . Stroke Mother   . Heart disease Father   . Peripheral Artery Disease Sister   . Diabetes Brother   . Diabetes Maternal Grandfather   . Heart attack Paternal Uncle   . Heart attack Paternal Uncle   . Heart attack Paternal Uncle   . Heart attack Paternal Uncle   . Heart attack Paternal Uncle   . Heart attack Paternal Uncle   . Heart attack Paternal Uncle   . Heart attack Brother   . Heart Problems Brother   . Heart attack Brother   . Stroke Brother   . Heart disease Brother   . Heart disease Other     Social History   Socioeconomic History  . Marital status: Married    Spouse name: [name]  . Number of children: 1  . Years of education: Not on file  . Highest education level: Some college, no degree  Occupational History  . Occupation: Retired  Scientific laboratory technician  . Financial resource strain: Somewhat hard  . Food insecurity    Worry: Sometimes true    Inability: Sometimes true  . Transportation needs    Medical: No    Non-medical: No  Tobacco Use  . Smoking status: Light Tobacco Smoker    Packs/day: 1.00    Years: 50.00    Pack years: 50.00    Types: Cigarettes    Start date: 05/26/2016  . Smokeless tobacco: Never Used  Substance and Sexual Activity  . Alcohol use: Not Currently    Alcohol/week: 0.0 standard drinks  . Drug use: No  . Sexual activity: Yes    Partners: Male  Lifestyle  . Physical activity    Days per week: 5 days    Minutes per session: 30 min  . Stress: Not at all  Relationships  . Social connections    Talks on phone: More  than three times a week    Gets together: More than three times a week    Attends religious service: More than 4 times per year    Active member of club or organization: No    Attends meetings of clubs or organizations: Never    Relationship status: Married  . Intimate partner violence    Fear of current or ex partner: No    Emotionally abused: No    Physically abused: No    Forced sexual activity: No  Other Topics Concern  . Not on file  Social History Narrative  . Not on file     Current Outpatient Medications:  .  ALPRAZolam (XANAX) 0.5 MG tablet, Take 1 tablet (0.5 mg total) by mouth as needed for anxiety., Disp: 30 tablet, Rfl: 0 .  aspirin EC  81 MG tablet, Take 1 tablet (81 mg total) by mouth daily., Disp: 30 tablet, Rfl: 0 .  calcium carbonate (TUMS - DOSED IN MG ELEMENTAL CALCIUM) 500 MG chewable tablet, Chew 1 tablet by mouth as needed for indigestion or heartburn., Disp: , Rfl:  .  Calcium Citrate-Vitamin D (CALCIUM + D PO), Take by mouth daily. , Disp: , Rfl:  .  magnesium gluconate (MAGONATE) 500 MG tablet, Take 500 mg by mouth daily., Disp: , Rfl:  .  Misc Natural Products (GLUCOSAMINE CHOND COMPLEX/MSM) TABS, Take by mouth daily., Disp: , Rfl:  .  olmesartan-hydrochlorothiazide (BENICAR HCT) 40-25 MG tablet, TAKE 1 TABLET BY MOUTH EVERY DAY, Disp: 90 tablet, Rfl: 1 .  rosuvastatin (CRESTOR) 10 MG tablet, Take 1 tablet (10 mg total) by mouth daily., Disp: 90 tablet, Rfl: 1 .  Simethicone (GAS RELIEF PO), Take by mouth as needed., Disp: , Rfl:  .  Vitamin D, Cholecalciferol, 1000 units TABS, Take 1 tablet by mouth daily., Disp: , Rfl:  .  VITAMIN E PO, Take by mouth daily., Disp: , Rfl:  .  loratadine (CLARITIN) 10 MG tablet, Take 1 tablet (10 mg total) by mouth daily. (Patient not taking: Reported on 12/29/2018), Disp: 30 tablet, Rfl: 11  No Known Allergies  I personally reviewed active problem list, medication list, allergies, family history, social history, health  maintenance with the patient/caregiver today.   ROS  Constitutional: Negative for fever , positive for  weight change.  Respiratory: Negative for cough and shortness of breath.   Cardiovascular: Negative for chest pain or palpitations.  Gastrointestinal: Negative for abdominal pain, no bowel changes.  Musculoskeletal: Negative for gait problem or joint swelling.  Skin: Negative for rash.  Neurological: Negative for dizziness or headache.  No other specific complaints in a complete review of systems (except as listed in HPI above).  Objective  Vitals:   12/29/18 1149  BP: 122/72  Pulse: (!) 103  Resp: 16  Temp: (!) 97.1 F (36.2 C)  TempSrc: Temporal  SpO2: 96%  Weight: 237 lb 9.6 oz (107.8 kg)  Height: 5\' 3"  (1.6 m)    Body mass index is 42.09 kg/m.  Physical Exam  Constitutional: Patient appears well-developed and well-nourished. Obese No distress.  HEENT: head atraumatic, normocephalic, pupils equal and reactive to light Cardiovascular: Normal rate, regular rhythm and normal heart sounds.  No murmur heard. No BLE edema. Pulmonary/Chest: Effort normal and breath sounds normal. No respiratory distress. Abdominal: Soft.  There is no tenderness. Psychiatric: Patient has a normal mood and affect. behavior is normal. Judgment and thought content normal.  PHQ2/9: Depression screen [facility] 2/9 12/29/2018 06/28/2018 01/03/2018 07/13/2017 04/14/2017  Decreased Interest 0 0 0 3 0  Down, Depressed, Hopeless 0 0 0 1 0  PHQ - 2 Score 0 0 0 4 0  Altered sleeping 1 0 0 3 0  Tired, decreased energy 0 0 0 3 0  Change in appetite 2 1 0 2 0  Feeling bad or failure about yourself  0 0 0 1 0  Trouble concentrating 0 0 0 3 0  Moving slowly or fidgety/restless 0 0 0 2 0  Suicidal thoughts 0 0 0 0 0  PHQ-9 Score 3 1 0 18 0  Difficult doing work/chores Not difficult at all Not difficult at all Not difficult at all Very difficult Not difficult at all    phq 9 is negative  Fall Risk: Fall  Risk  12/29/2018 06/28/2018 01/03/2018 07/13/2017 07/13/2017  Falls in  the past year? 0 0 0 No No  Number falls in past yr: 0 0 - - -  Injury with Fall? 0 0 - - -  Comment - - - - -     Functional Status Survey: Is the patient deaf or have difficulty hearing?: No Does the patient have difficulty seeing, even when wearing glasses/contacts?: Yes Does the patient have difficulty concentrating, remembering, or making decisions?: No Does the patient have difficulty walking or climbing stairs?: No Does the patient have difficulty dressing or bathing?: No Does the patient have difficulty doing errands alone such as visiting a doctor's office or shopping?: No    Assessment & Plan  1. Benign hypertension  - olmesartan-hydrochlorothiazide (BENICAR HCT) 40-25 MG tablet; Take 1 tablet by mouth daily.  Dispense: 90 tablet; Refill: 1  2. Atherosclerosis of aorta (HCC)  - rosuvastatin (CRESTOR) 20 MG tablet; Take 1 tablet (20 mg total) by mouth daily.  Dispense: 90 tablet; Refill: 1  3. Coronary artery calcification  - rosuvastatin (CRESTOR) 20 MG tablet; Take 1 tablet (20 mg total) by mouth daily.  Dispense: 90 tablet; Refill: 1  4. Generalized anxiety disorder  Doing well   5. Dyslipidemia  - rosuvastatin (CRESTOR) 20 MG tablet; Take 1 tablet (20 mg total) by mouth daily.  Dispense: 90 tablet; Refill: 1  6. Morbid obesity, unspecified obesity type [facility])  Discussed with the patient the risk posed by an increased BMI. Discussed importance of portion control, calorie counting and at least 150 minutes of physical activity weekly. Avoid sweet beverages and drink more water. Eat at least 6 servings of fruit and vegetables daily   7. Centrilobular emphysema (HCC)  Not on medication, getting yearly CT   8. Metabolic syndrome   9. Binge eating disorder  She decided to stop medication and is gaining weight.

## 2019-01-01 ENCOUNTER — Telehealth: Payer: Self-pay

## 2019-01-01 NOTE — Telephone Encounter (Signed)
Copied from [place] [id number]. Topic: General - Call Back - No Documentation >> Jan 01, 2019 11:37 AM Erick Blinks wrote: Call back request from [facility] regarding medication questions, please advise [phone]

## 2019-01-01 NOTE — Telephone Encounter (Signed)
Patient just wanted to clarify that Dr. Ancil Boozer increased her Rosuvastatin. Informed patient yes she increased from 10 mg to 20 mg and per Dr. Ancil Boozer to finish what she had at home by taking 2 of the Crestor until she ran out.

## 2019-01-25 ENCOUNTER — Telehealth: Payer: Self-pay

## 2019-01-25 DIAGNOSIS — Z87891 Personal history of nicotine dependence: Secondary | ICD-10-CM

## 2019-01-25 NOTE — Telephone Encounter (Signed)
Attempted to call pt to inform her that it is time for her annual lung cancer screening. Unable to leave voicemail at this time due to mailbox being full.

## 2019-01-26 NOTE — Telephone Encounter (Signed)
Patient has been notified that annual lung cancer screening low dose CT scan is due currently or will be in near future. Confirmed that patient is within the age range of 55-77, and asymptomatic, (no signs or symptoms of lung cancer). Patient denies illness that would prevent curative treatment for lung cancer if found. Verified smoking history, (current, 51.25 pack year). The shared decision making visit was done 07/28/16. Patient is agreeable for CT scan being scheduled.

## 2019-01-26 NOTE — Addendum Note (Signed)
Addended by: Lieutenant Diego on: 01/26/2019 10:21 AM   Modules accepted: Orders

## 2019-02-01 ENCOUNTER — Other Ambulatory Visit: Payer: Self-pay

## 2019-02-01 ENCOUNTER — Ambulatory Visit
Admission: RE | Admit: 2019-02-01 | Discharge: 2019-02-01 | Disposition: A | Discharge status: Home / Self Care | Payer: Federal, State, Local not specified - PPO | Source: Ambulatory Visit | Attending: Nurse Practitioner | Admitting: Nurse Practitioner

## 2019-02-01 DIAGNOSIS — Z87891 Personal history of nicotine dependence: Secondary | ICD-10-CM | POA: Diagnosis not present

## 2019-02-05 ENCOUNTER — Telehealth: Payer: Self-pay | Admitting: *Deleted

## 2019-02-05 NOTE — Telephone Encounter (Signed)
Notified patient of LDCT lung cancer screening program results with recommendation for 3 month follow up imaging. Also notified of incidental findings noted below and is encouraged to discuss further with PCP who will receive a copy of this note and/or the CT report. Patient verbalizes understanding.   IMPRESSION: 1. Lung-RADS 4A, suspicious. Innumerable new poorly defined subcentimeter bilateral pulmonary nodules, upper lobe predominant, more likely infectious/inflammatory. Follow up low-dose chest CT without contrast in 3 months (please use the following order, "CT CHEST LCS NODULE FOLLOW-UP W/O CM") is recommended. Alternatively, PET may be considered when there is a solid component 34mm or larger. 2. Two-vessel coronary atherosclerosis. 3. Small hiatal hernia.  Aortic Atherosclerosis (ICD10-I70.0) and Emphysema (ICD10-J43.9).

## 2019-05-04 ENCOUNTER — Telehealth: Payer: Self-pay | Admitting: *Deleted

## 2019-05-04 DIAGNOSIS — R918 Other nonspecific abnormal finding of lung field: Secondary | ICD-10-CM

## 2019-05-04 DIAGNOSIS — Z87891 Personal history of nicotine dependence: Secondary | ICD-10-CM

## 2019-05-04 NOTE — Telephone Encounter (Signed)
Contacted and scheduled for LCS nodule follow up 

## 2019-05-09 ENCOUNTER — Ambulatory Visit
Admission: RE | Admit: 2019-05-09 | Discharge: 2019-05-09 | Disposition: A | Discharge status: Home / Self Care | Payer: Federal, State, Local not specified - PPO | Source: Ambulatory Visit | Attending: Nurse Practitioner | Admitting: Nurse Practitioner

## 2019-05-09 ENCOUNTER — Other Ambulatory Visit: Payer: Self-pay

## 2019-05-09 DIAGNOSIS — Z87891 Personal history of nicotine dependence: Secondary | ICD-10-CM

## 2019-05-09 DIAGNOSIS — R918 Other nonspecific abnormal finding of lung field: Secondary | ICD-10-CM | POA: Diagnosis present

## 2019-05-11 ENCOUNTER — Encounter: Payer: Self-pay | Admitting: *Deleted

## 2019-06-29 ENCOUNTER — Encounter: Payer: Self-pay | Admitting: Family Medicine

## 2019-06-29 ENCOUNTER — Ambulatory Visit: Payer: Federal, State, Local not specified - PPO | Admitting: Family Medicine

## 2019-06-29 ENCOUNTER — Other Ambulatory Visit: Payer: Self-pay

## 2019-06-29 VITALS — BP 120/80 | HR 97 | Temp 97.3°F | Resp 16 | Ht 62.0 in | Wt 234.3 lb

## 2019-06-29 DIAGNOSIS — Z1211 Encounter for screening for malignant neoplasm of colon: Secondary | ICD-10-CM

## 2019-06-29 DIAGNOSIS — F411 Generalized anxiety disorder: Secondary | ICD-10-CM

## 2019-06-29 DIAGNOSIS — J432 Centrilobular emphysema: Secondary | ICD-10-CM

## 2019-06-29 DIAGNOSIS — E785 Hyperlipidemia, unspecified: Secondary | ICD-10-CM

## 2019-06-29 DIAGNOSIS — I2510 Atherosclerotic heart disease of native coronary artery without angina pectoris: Secondary | ICD-10-CM | POA: Diagnosis not present

## 2019-06-29 DIAGNOSIS — I700 Atherosclerosis of aorta: Secondary | ICD-10-CM | POA: Diagnosis not present

## 2019-06-29 DIAGNOSIS — I10 Essential (primary) hypertension: Secondary | ICD-10-CM | POA: Diagnosis not present

## 2019-06-29 DIAGNOSIS — I2584 Coronary atherosclerosis due to calcified coronary lesion: Secondary | ICD-10-CM

## 2019-06-29 DIAGNOSIS — F330 Major depressive disorder, recurrent, mild: Secondary | ICD-10-CM

## 2019-06-29 DIAGNOSIS — Z0000 Encounter for general adult medical examination without abnormal findings: Secondary | ICD-10-CM | POA: Diagnosis not present

## 2019-06-29 DIAGNOSIS — E8881 Metabolic syndrome: Secondary | ICD-10-CM

## 2019-06-29 DIAGNOSIS — J3089 Other allergic rhinitis: Secondary | ICD-10-CM

## 2019-06-29 DIAGNOSIS — R739 Hyperglycemia, unspecified: Secondary | ICD-10-CM

## 2019-06-29 DIAGNOSIS — F5081 Binge eating disorder: Secondary | ICD-10-CM

## 2019-06-29 DIAGNOSIS — J302 Other seasonal allergic rhinitis: Secondary | ICD-10-CM

## 2019-06-29 MED ORDER — OLMESARTAN MEDOXOMIL-HCTZ 40-25 MG PO TABS: 1.0000 | ORAL_TABLET | Freq: Every day | ORAL | 1 refills | Status: DC

## 2019-06-29 MED ORDER — CITALOPRAM HYDROBROMIDE 20 MG PO TABS: 20.0000 mg | ORAL_TABLET | Freq: Every day | ORAL | 0 refills | Status: DC

## 2019-06-29 MED ORDER — ALPRAZOLAM 0.5 MG PO TABS: 0.5000 mg | ORAL_TABLET | ORAL | 0 refills | Status: DC | PRN

## 2019-06-29 MED ORDER — ROSUVASTATIN CALCIUM 20 MG PO TABS: 20.0000 mg | ORAL_TABLET | Freq: Every day | ORAL | 1 refills | Status: DC

## 2019-06-29 NOTE — Progress Notes (Signed)
Name: Veronica Mendez   MRN: [id number]    DOB: 1950/11/08   Date:06/29/2019       Progress Note  Subjective  Chief Complaint  Chief Complaint  Patient presents with  . Annual Exam    HPI  Patient presents for annual CPE and follow up  HTN: bpis at goal. Advised her tocontinue Benicar HCTZfor bp.No chest pain, dizziness, palpitation or decrease in exercise tolerance   Hyperlipidemia: She was found to have calcification of coronary vessels found on CT chest,she is on high dose statin now,last LDL below 100, we will recheck labs next visit   Depression/GAD:shewas onLexapro for many years, unable to take Cymbaltawe switched to Citalopram and did not help, sheiswason Effexor since 02/2017 she had a relapse and may 2019 we increased dose, she started to feel better, started to do some meditation and establish boundaries and started to feel better. She is grieving, lost a nephew 2 years ago, one brother died in 05/03/2018 and youngest brother died in 01-Jun-2019, she has been trying not to cry, but is feeling down, she feels overwhelmed, she has a sister in law that takes Citalopram and would like to try that again.   FMS: sheis eating healthier and states pain is much better. She does not want to take Lyrica or Gabapentin, going to chiropractor for neck pain, she states a low carb diet has helped symptoms   Obesity: She started Vyvanse back inApril 2018at a weight of 252 lbs.She likes to eat food - not necessarily desserts. She never tried laxatives or throwing up to help with weight loss. She hadlost32lbs , stopped medication and gained some weight, but doing well now, stable   Metabolic Syndrome: she has polyphagia, no polydipsia or polyuria. She had elevated fasting insulin, she is doing well with life style modification.Last A1C was back to normal   Centrolobular emphysema: she has mild dry cough in am's, but no SOB or wheezing.She states worse when laying down ,  she does not want to use an inhaler. She is still smoking, not ready to quit   Atherosclerosis , aorta : discussed results from CT chest done,she is on high dose Crestor andaspirin, seen by cardiologist and given reassurance. She also has significant family history of heart disease.   Lung nodule: previous smoker,she went back in Dec, stable in size and repeat in one year. Ordered by Dr. Grayland Ormond.last one done 2019/06/01   AR: okay at this time, uses saline spray to control symptoms    Diet: low carb diet  Exercise: increase physical activity   USPSTF grade A and B recommendations    [facility] Visit from 12/29/2018 in [facility]  AUDIT-C Score  0     Depression: Phq 9 is  positive Depression screen [facility] 2/9 06/29/2019 06/29/2019 12/29/2018 06/28/2018 01/03/2018  Decreased Interest 1 0 0 0 0  Down, Depressed, Hopeless 2 0 0 0 0  PHQ - 2 Score 3 0 0 0 0  Altered sleeping 1 0 1 0 0  Tired, decreased energy 1 0 0 0 0  Change in appetite 0 0 2 1 0  Feeling bad or failure about yourself  0 0 0 0 0  Trouble concentrating 1 0 0 0 0  Moving slowly or fidgety/restless 0 0 0 0 0  Suicidal thoughts 0 0 0 0 0  PHQ-9 Score 6 0 3 1 0  Difficult doing work/chores Somewhat difficult - Not difficult at all Not difficult at all  Not difficult at all  Some recent data might be hidden   Hypertension: BP Readings from Last 3 Encounters:  06/29/19 120/80  12/29/18 122/72  06/28/18 130/82   Obesity: Wt Readings from Last 3 Encounters:  06/29/19 234 lb 4.8 oz (106.3 kg)  02/01/19 237 lb (107.5 kg)  12/29/18 237 lb 9.6 oz (107.8 kg)   BMI Readings from Last 3 Encounters:  06/29/19 42.85 kg/m  02/01/19 41.98 kg/m  12/29/18 42.09 kg/m     Hep C Screening: 04/2012  STD testing and prevention (HIV/chl/gon/syphilis): not interested  Intimate partner violence: negative screen  Sexual History (Partners/Practices/Protection from [employer] hx STI/Pregnancy Plans): not  sexually active  Pain during Intercourse:N/A Menstrual History/LMP/Abnormal Bleeding: s/p hysterectomy  Incontinence Symptoms: no problems   Breast cancer:  - Last Mammogram: 04/2019 and normal  - BRCA gene screening: N/A  Osteoporosis: Discussed high calcium and vitamin D supplementation, weight bearing exercises  Cervical cancer screening: N/A  Skin cancer: Discussed monitoring for atypical lesions  Colorectal cancer: she does not want a colonoscopy, we will order a cologuard  Lung cancer:  Last CT done 05/09/2019    ECG: 2018   Advanced Care Planning: A voluntary discussion about advance care planning including the explanation and discussion of advance directives.  Discussed health care proxy and Living will, and the patient was able to identify a health care proxy as husband   Patient does have a living will at present time. Lipids: Lab Results  Component Value Date   CHOL 174 08/23/2018   CHOL 159 07/13/2017   CHOL 198 07/05/2016   Lab Results  Component Value Date   HDL 64 08/23/2018   HDL 52 07/13/2017   HDL 50 07/05/2016   Lab Results  Component Value Date   LDLCALC 91 08/23/2018   LDLCALC 87 07/13/2017   LDLCALC 118 (H) 07/05/2016   Lab Results  Component Value Date   TRIG 96 08/23/2018   TRIG 107 07/13/2017   TRIG 149 07/05/2016   Lab Results  Component Value Date   CHOLHDL 2.7 08/23/2018   CHOLHDL 3.1 07/13/2017   CHOLHDL 3.6 05/09/2015   No results found for: LDLDIRECT  Glucose: Glucose  Date Value Ref Range Status  07/05/2016 96 65 - 99 mg/dL Final  05/09/2015 95 65 - 99 mg/dL Final   Glucose, Bld  Date Value Ref Range Status  08/23/2018 105 (H) 65 - 99 mg/dL Final    Comment:    .            Fasting reference interval . For someone without known diabetes, a glucose value between 100 and 125 mg/dL is consistent with prediabetes and should be confirmed with a follow-up test. .   07/13/2017 96 65 - 139 mg/dL Final    Comment:    .         Non-fasting reference interval .     Patient Active Problem List   Diagnosis Date Noted  . Morbid obesity, unspecified obesity type ([place]) 06/28/2018  . Depression, major, recurrent, mild ([place]) 06/28/2018  . Coronary artery calcification 09/06/2016  . Lung nodule, solitary 07/30/2016  . Atherosclerosis of aorta ([place]) 07/30/2016  . Centrilobular emphysema ([place]) 07/30/2016  . Personal history of tobacco use, presenting hazards to health 07/30/2016  . Binge eating disorder 11/10/2015  . Hyperglycemia 07/09/2015  . Radiculitis of left cervical region 10/28/2014  . Vitamin D deficiency 10/26/2014  . Lumbosacral spondylosis without myelopathy 10/26/2014  . Extreme obesity 10/26/2014  . Tennis Must  Quervain's disease (radial styloid tenosynovitis) 10/26/2014  . Nephrolithiasis 10/23/2014  . Trigger finger, right 10/23/2014  . Carpal tunnel syndrome 10/23/2014  . Generalized anxiety disorder 10/23/2014  . Major depression ([place]) 10/23/2014  . Fibromyalgia 10/23/2014  . Perennial allergic rhinitis 10/23/2014  . Dyslipidemia 10/23/2014  . Benign hypertension 10/23/2014  . Morbid drug-induced obesity 10/23/2014  . Insomnia 10/23/2014  . Metabolic syndrome 54/49/2010  . Tobacco use disorder 10/23/2014  . Menopause syndrome 10/23/2014  . Lumbago 10/23/2014  . Osteoarthritis 10/23/2014  . Atrophic vaginitis 10/23/2014    Past Surgical History:  Procedure Laterality Date  . ABDOMINAL HYSTERECTOMY Bilateral 1988  . KNEE SURGERY Left 2014    Family History  Problem Relation Age of Onset  . Osteoarthritis Mother   . Stroke Mother   . Heart disease Father   . Peripheral Artery Disease Sister   . Diabetes Brother   . Heart attack Brother   . Diabetes Maternal Grandfather   . Heart attack Paternal Uncle   . Heart attack Paternal Uncle   . Heart attack Paternal Uncle   . Heart attack Paternal Uncle   . Heart attack Paternal Uncle   . Heart attack Paternal Uncle   . Heart attack  Paternal Uncle   . Heart attack Brother   . Atrial fibrillation Brother   . Heart Problems Brother   . Heart attack Brother   . Stroke Brother   . Heart disease Brother   . Heart disease Other     Social History   Socioeconomic History  . Marital status: Married    Spouse name: [name]  . Number of children: 1  . Years of education: Not on file  . Highest education level: Some college, no degree  Occupational History  . Occupation: Retired  Tobacco Use  . Smoking status: Light Tobacco Smoker    Packs/day: 1.00    Years: 50.00    Pack years: 50.00    Types: Cigarettes    Start date: 05/26/2016  . Smokeless tobacco: Never Used  Substance and Sexual Activity  . Alcohol use: Not Currently    Alcohol/week: 0.0 standard drinks  . Drug use: No  . Sexual activity: Yes    Partners: Male  Other Topics Concern  . Not on file  Social History Narrative  . Not on file   Social Determinants of Health   Financial Resource Strain:   . Difficulty of Paying Living Expenses:   Food Insecurity:   . Worried About Charity fundraiser in the Last Year:   . Arboriculturist in the Last Year:   Transportation Needs:   . Film/video editor (Medical):   Marland Kitchen Lack of Transportation (Non-Medical):   Physical Activity:   . Days of Exercise per Week:   . Minutes of Exercise per Session:   Stress:   . Feeling of Stress :   Social Connections:   . Frequency of Communication with Friends and Family:   . Frequency of Social Gatherings with Friends and Family:   . Attends Religious Services:   . Active Member of Clubs or Organizations:   . Attends Archivist Meetings:   Marland Kitchen Marital Status:   Intimate Partner Violence:   . Fear of Current or Ex-Partner:   . Emotionally Abused:   Marland Kitchen Physically Abused:   . Sexually Abused:      Current Outpatient Medications:  .  ALPRAZolam (XANAX) 0.5 MG tablet, Take 1 tablet (0.5 mg total) by mouth  as needed for anxiety., Disp: 30 tablet, Rfl:  0 .  ascorbic acid (VITAMIN C) 500 MG tablet, Take 500 mg by mouth 2 (two) times daily., Disp: , Rfl:  .  aspirin EC 81 MG tablet, Take 1 tablet (81 mg total) by mouth daily., Disp: 30 tablet, Rfl: 0 .  calcium carbonate (TUMS - DOSED IN MG ELEMENTAL CALCIUM) 500 MG chewable tablet, Chew 1 tablet by mouth as needed for indigestion or heartburn., Disp: , Rfl:  .  Calcium Citrate-Vitamin D (CALCIUM + D PO), Take by mouth daily. , Disp: , Rfl:  .  magnesium gluconate (MAGONATE) 500 MG tablet, Take 500 mg by mouth daily., Disp: , Rfl:  .  Misc Natural Products (GLUCOSAMINE CHOND COMPLEX/MSM) TABS, Take by mouth daily., Disp: , Rfl:  .  olmesartan-hydrochlorothiazide (BENICAR HCT) 40-25 MG tablet, Take 1 tablet by mouth daily., Disp: 90 tablet, Rfl: 1 .  rosuvastatin (CRESTOR) 20 MG tablet, Take 1 tablet (20 mg total) by mouth daily., Disp: 90 tablet, Rfl: 1 .  Simethicone (GAS RELIEF PO), Take by mouth as needed., Disp: , Rfl:  .  Vitamin D, Cholecalciferol, 1000 units TABS, Take 1 tablet by mouth daily., Disp: , Rfl:  .  VITAMIN E PO, Take by mouth daily., Disp: , Rfl:   No Known Allergies   ROS  Constitutional: Negative for fever , positive for weight change.  Respiratory: Negative for cough and shortness of breath.   Cardiovascular: Negative for chest pain or palpitations.  Gastrointestinal: Negative for abdominal pain, no bowel changes.  Musculoskeletal: Negative for gait problem or joint swelling.  Skin: Negative for rash.  Neurological: Negative for dizziness or headache.  No other specific complaints in a complete review of systems (except as listed in HPI above).  Objective  Vitals:   06/29/19 1202  BP: 120/80  Pulse: 97  Resp: 16  Temp: (!) 97.3 F (36.3 C)  TempSrc: Temporal  SpO2: 96%  Weight: 234 lb 4.8 oz (106.3 kg)  Height: '5\' 2"'  (1.575 m)    Body mass index is 42.85 kg/m.  Physical Exam  Constitutional: Patient appears well-developed and well-nourished. No  distress.  HENT: Head: Normocephalic and atraumatic. Ears: B TMs ok, no erythema or effusion; Nose: not done  Mouth/Throat: not done  Eyes: Conjunctivae and EOM are normal. Pupils are equal, round, and reactive to light. No scleral icterus.  Neck: Normal range of motion. Neck supple. No JVD present. No thyromegaly present.  Cardiovascular: Normal rate, regular rhythm and normal heart sounds.  No murmur heard. No BLE edema. Pulmonary/Chest: Effort normal and breath sounds normal. No respiratory distress. Abdominal: Soft. Bowel sounds are normal, no distension. There is no tenderness. no masses Breast: no lumps or masses, no nipple discharge or rashes FEMALE GENITALIA:  Not done RECTAL: not done  Musculoskeletal: Normal range of motion, no joint effusions. No gross deformities Neurological: he is alert and oriented to person, place, and time. No cranial nerve deficit. Coordination, balance, strength, speech and gait are normal.  Skin: Skin is warm and dry. No rash noted. No erythema.  Psychiatric: Patient has a normal mood and affect. behavior is normal. Judgment and thought content normal.  Fall Risk: Fall Risk  06/29/2019 12/29/2018 06/28/2018 01/03/2018 07/13/2017  Falls in the past year? 0 0 0 0 No  Number falls in past yr: 0 0 0 - -  Injury with Fall? 0 0 0 - -  Comment - - - - -     Functional  Status Survey: Is the patient deaf or have difficulty hearing?: No Does the patient have difficulty seeing, even when wearing glasses/contacts?: No Does the patient have difficulty concentrating, remembering, or making decisions?: No Does the patient have difficulty walking or climbing stairs?: No Does the patient have difficulty dressing or bathing?: No Does the patient have difficulty doing errands alone such as visiting a doctor's office or shopping?: No  Assessment & Plan  1. Benign hypertension  bp at goa l - olmesartan-hydrochlorothiazide (BENICAR HCT) 40-25 MG tablet; Take 1 tablet  by mouth daily.  Dispense: 90 tablet; Refill: 1  2. Atherosclerosis of aorta (HCC)  - rosuvastatin (CRESTOR) 20 MG tablet; Take 1 tablet (20 mg total) by mouth daily.  Dispense: 90 tablet; Refill: 1  3. Coronary artery calcification  - rosuvastatin (CRESTOR) 20 MG tablet; Take 1 tablet (20 mg total) by mouth daily.  Dispense: 90 tablet; Refill: 1  4. Morbid obesity, unspecified obesity type [facility])  Discussed with the patient the risk posed by an increased BMI. Discussed importance of portion control, calorie counting and at least 150 minutes of physical activity weekly. Avoid sweet beverages and drink more water. Eat at least 6 servings of fruit and vegetables daily   5. Dyslipidemia  - rosuvastatin (CRESTOR) 20 MG tablet; Take 1 tablet (20 mg total) by mouth daily.  Dispense: 90 tablet; Refill: 1  6. Metabolic syndrome   7. Centrilobular emphysema (HCC)  Not on medication, states feeling well  8. Generalized anxiety disorder  - ALPRAZolam (XANAX) 0.5 MG tablet; Take 1 tablet (0.5 mg total) by mouth as needed for anxiety.  Dispense: 30 tablet; Refill: 0  9. Binge eating disorder  She stopped Vyvanse on her own   10. Hyperglycemia  She would like to recheck labs in the Fall  11. Perennial allergic rhinitis with seasonal variation   12. Well adult exam   13. Colon cancer screening  - Cologuard  14. Mild recurrent major depression (HCC)  - citalopram (CELEXA) 20 MG tablet; Take 1 tablet (20 mg total) by mouth daily.  Dispense: 90 tablet; Refill: 0  -USPSTF grade A and B recommendations reviewed with patient; age-appropriate recommendations, preventive care, screening tests, etc discussed and encouraged; healthy living encouraged; see AVS for patient education given to patient -Discussed importance of 150 minutes of physical activity weekly, eat two servings of fish weekly, eat one serving of tree nuts ( cashews, pistachios, pecans, almonds.Marland Kitchen) every other day, eat 6  servings of fruit/vegetables daily and drink plenty of water and avoid sweet beverages.

## 2019-06-29 NOTE — Patient Instructions (Signed)
Preventive Care 38 Years and Older, Female Preventive care refers to lifestyle choices and visits with your health care provider that can promote health and wellness. This includes:  A yearly physical exam. This is also called an annual well check.  Regular dental and eye exams.  Immunizations.  Screening for certain conditions.  Healthy lifestyle choices, such as diet and exercise. What can I expect for my preventive care visit? Physical exam Your health care provider will check:  Height and weight. These may be used to calculate body mass index (BMI), which is a measurement that tells if you are at a healthy weight.  Heart rate and blood pressure.  Your skin for abnormal spots. Counseling Your health care provider may ask you questions about:  Alcohol, tobacco, and drug use.  Emotional well-being.  Home and relationship well-being.  Sexual activity.  Eating habits.  History of falls.  Memory and ability to understand (cognition).  Work and work Statistician.  Pregnancy and menstrual history. What immunizations do I need?  Influenza (flu) vaccine  This is recommended every year. Tetanus, diphtheria, and pertussis (Tdap) vaccine  You may need a Td booster every 10 years. Varicella (chickenpox) vaccine  You may need this vaccine if you have not already been vaccinated. Zoster (shingles) vaccine  You may need this after age 33. Pneumococcal conjugate (PCV13) vaccine  One dose is recommended after age 33. Pneumococcal polysaccharide (PPSV23) vaccine  One dose is recommended after age 72. Measles, mumps, and rubella (MMR) vaccine  You may need at least one dose of MMR if you were born in 1957 or later. You may also need a second dose. Meningococcal conjugate (MenACWY) vaccine  You may need this if you have certain conditions. Hepatitis A vaccine  You may need this if you have certain conditions or if you travel or work in places where you may be exposed  to hepatitis A. Hepatitis B vaccine  You may need this if you have certain conditions or if you travel or work in places where you may be exposed to hepatitis B. Haemophilus influenzae type b (Hib) vaccine  You may need this if you have certain conditions. You may receive vaccines as individual doses or as more than one vaccine together in one shot (combination vaccines). Talk with your health care provider about the risks and benefits of combination vaccines. What tests do I need? Blood tests  Lipid and cholesterol levels. These may be checked every 5 years, or more frequently depending on your overall health.  Hepatitis C test.  Hepatitis B test. Screening  Lung cancer screening. You may have this screening every year starting at age 39 if you have a 30-pack-year history of smoking and currently smoke or have quit within the past 15 years.  Colorectal cancer screening. All adults should have this screening starting at age 36 and continuing until age 15. Your health care provider may recommend screening at age 23 if you are at increased risk. You will have tests every 1-10 years, depending on your results and the type of screening test.  Diabetes screening. This is done by checking your blood sugar (glucose) after you have not eaten for a while (fasting). You may have this done every 1-3 years.  Mammogram. This may be done every 1-2 years. Talk with your health care provider about how often you should have regular mammograms.  BRCA-related cancer screening. This may be done if you have a family history of breast, ovarian, tubal, or peritoneal cancers.  Other tests  Sexually transmitted disease (STD) testing.  Bone density scan. This is done to screen for osteoporosis. You may have this done starting at age 44. Follow these instructions at home: Eating and drinking  Eat a diet that includes fresh fruits and vegetables, whole grains, lean protein, and low-fat dairy products. Limit  your intake of foods with high amounts of sugar, saturated fats, and salt.  Take vitamin and mineral supplements as recommended by your health care provider.  Do not drink alcohol if your health care provider tells you not to drink.  If you drink alcohol: ? Limit how much you have to 0-1 drink a day. ? Be aware of how much alcohol is in your drink. In the [place] one drink equals one 12 oz bottle of beer (355 mL), one 5 oz glass of wine (148 mL), or one 1 oz glass of hard liquor (44 mL). Lifestyle  Take daily care of your teeth and gums.  Stay active. Exercise for at least 30 minutes on 5 or more days each week.  Do not use any products that contain nicotine or tobacco, such as cigarettes, e-cigarettes, and chewing tobacco. If you need help quitting, ask your health care provider.  If you are sexually active, practice safe sex. Use a condom or other form of protection in order to prevent STIs (sexually transmitted infections).  Talk with your health care provider about taking a low-dose aspirin or statin. What's next?  Go to your health care provider once a year for a well check visit.  Ask your health care provider how often you should have your eyes and teeth checked.  Stay up to date on all vaccines. This information is not intended to replace advice given to you by your health care provider. Make sure you discuss any questions you have with your health care provider. Document Revised: 01/26/2018 Document Reviewed: 01/26/2018 Elsevier Patient Education  2020 [employer].

## 2019-09-25 ENCOUNTER — Other Ambulatory Visit: Payer: Self-pay | Admitting: Family Medicine

## 2019-09-25 DIAGNOSIS — F330 Major depressive disorder, recurrent, mild: Secondary | ICD-10-CM

## 2019-09-25 NOTE — Telephone Encounter (Signed)
Requested Prescriptions  Pending Prescriptions Disp Refills  . citalopram (CELEXA) 20 MG tablet [Pharmacy Med Name: CITALOPRAM HBR 20 MG TABLET] 90 tablet 0    Sig: TAKE 1 TABLET BY MOUTH EVERY DAY     Psychiatry:  Antidepressants - SSRI Passed - 09/25/2019  1:41 AM      Passed - Completed PHQ-2 or PHQ-9 in the last 360 days.      Passed - Valid encounter within last 6 months    Recent Outpatient Visits          2 months ago Benign hypertension   [facility] Steele Sizer, MD   9 months ago Atherosclerosis of aorta [facility])   [facility] Steele Sizer, MD   1 year ago Morbid obesity, unspecified obesity type [facility])   [facility] Steele Sizer, MD   1 year ago Benign hypertension   [facility] Steele Sizer, MD   2 years ago Atherosclerosis of aorta [facility])   [facility] Steele Sizer, MD      Future Appointments            In 1 week Steele Sizer, MD [facility]

## 2019-10-02 ENCOUNTER — Ambulatory Visit: Payer: Federal, State, Local not specified - PPO | Admitting: Family Medicine

## 2019-12-31 NOTE — Progress Notes (Signed)
Name: Veronica Mendez   MRN: [id number]    DOB: [birth year]   Date:01/01/2020       Progress Note  Subjective  Chief Complaint  Follow up  HPI   HTN: bpis at goal. Advised her tocontinue Benicar HCTZfor bp.No chest pain, dizziness, palpitation or decrease in exercise tolerance Unchanged   Hyperlipidemia: She was found to have calcification of coronary vessels found on CT chest,she is on high dose statin now,last LDL below 100, and she is due for repeat labs   Depression/GAD:shewas onLexapro for many years, unable to take Cymbaltawe switched to Citalopram and did not help, sheiswason Effexor since 02/2017 she had a relapse and may 2019 we increased dose, she started to feel better, started to do some meditation and establish boundaries and started to feel better. She is grieving, lost a nephew 2.5 years ago, one brother died in May 31, 2018 and youngest brother died in 2019-05-31, she asked back in May to resume medication and took Lexapro for about 3 weeks and since it was not helping she stopped taking medication. She has been meditating and is doing much better by processing her grieve. She has some agoraphobia and takes alprazolam prn only   FMS: sheis eating healthier and states pain is much better. She does not want to take Lyrica or Gabapentin, going to chiropractor for neck pain, she states a low carb diet has helped symptoms. Unchanged    Obesity: She started Vyvanse back inApril 2018at a weight of 252 lbs.She likes to eat food - not necessarily desserts. She never tried laxatives or throwing up to help with weight loss. She hadlost32lbs white on Vyvanse but stopped taking in over 6 months, she has been able to lose 13 lbs since May by following a very low carbohydrate diet. Discussed having colonoscopy done , mammogram is up to date   Metabolic Syndrome: she has polyphagia, no polydipsia or polyuria. She had elevated fasting insulin, she is doing well with life  style modification.Last A1C was back to normal but due for repeat levels She is losing weight but cutting down on carbohydrates   Centrolobular emphysema: she has mild dry cough in am's, but no SOB or wheezing.She states worse when laying down , she does not want to use an inhaler. She is still smoking, but smoking at most 5 cigarettes daily.   Atherosclerosis , aorta : discussed results from CT chest done,she is on high dose Crestor andaspirin, seen by cardiologist and given reassurance. She also has significant family history of heart disease,but denies chest pain. She was advised to have labs rechecked goal is below 70  Lung nodule: she is still smoking down to 5 cigarettes daily but unable to quit, last CT lung was May 31, 2019 and is going back yearly now.    Patient Active Problem List   Diagnosis Date Noted  . Morbid obesity, unspecified obesity type ([place]) 06/28/2018  . Depression, major, recurrent, mild ([place]) 06/28/2018  . Coronary artery calcification 09/06/2016  . Lung nodule, solitary 07/30/2016  . Atherosclerosis of aorta ([place]) 07/30/2016  . Centrilobular emphysema ([place]) 07/30/2016  . Personal history of tobacco use, presenting hazards to health 07/30/2016  . Binge eating disorder 11/10/2015  . Hyperglycemia 07/09/2015  . Radiculitis of left cervical region 10/28/2014  . Vitamin D deficiency 10/26/2014  . Lumbosacral spondylosis without myelopathy 10/26/2014  . Extreme obesity 10/26/2014  . De Quervain's disease (radial styloid tenosynovitis) 10/26/2014  . Nephrolithiasis 10/23/2014  . Trigger finger, right 10/23/2014  . Carpal  tunnel syndrome 10/23/2014  . Generalized anxiety disorder 10/23/2014  . Major depression ([place]) 10/23/2014  . Fibromyalgia 10/23/2014  . Perennial allergic rhinitis 10/23/2014  . Dyslipidemia 10/23/2014  . Benign hypertension 10/23/2014  . Morbid drug-induced obesity 10/23/2014  . Insomnia 10/23/2014  . Metabolic syndrome 86/76/7209  .  Tobacco use disorder 10/23/2014  . Menopause syndrome 10/23/2014  . Lumbago 10/23/2014  . Osteoarthritis 10/23/2014  . Atrophic vaginitis 10/23/2014    Past Surgical History:  Procedure Laterality Date  . ABDOMINAL HYSTERECTOMY Bilateral 1988  . KNEE SURGERY Left 2014    Family History  Problem Relation Age of Onset  . Osteoarthritis Mother   . Stroke Mother   . Heart disease Father   . Peripheral Artery Disease Sister   . Diabetes Brother   . Heart attack Brother   . Diabetes Maternal Grandfather   . Heart attack Paternal Uncle   . Heart attack Paternal Uncle   . Heart attack Paternal Uncle   . Heart attack Paternal Uncle   . Heart attack Paternal Uncle   . Heart attack Paternal Uncle   . Heart attack Paternal Uncle   . Heart attack Brother   . Atrial fibrillation Brother   . Heart Problems Brother   . Heart attack Brother   . Stroke Brother   . Heart disease Brother   . Heart disease Other     Social History   Tobacco Use  . Smoking status: Light Tobacco Smoker    Packs/day: 1.00    Years: 50.00    Pack years: 50.00    Types: Cigarettes    Start date: 05/26/2016  . Smokeless tobacco: Never Used  Substance Use Topics  . Alcohol use: Not Currently    Alcohol/week: 0.0 standard drinks     Current Outpatient Medications:  .  ALPRAZolam (XANAX) 0.5 MG tablet, Take 1 tablet (0.5 mg total) by mouth as needed for anxiety., Disp: 30 tablet, Rfl: 0 .  amoxicillin (AMOXIL) 500 MG capsule, Take 500 mg by mouth 3 (three) times daily., Disp: , Rfl:  .  ascorbic acid (VITAMIN C) 500 MG tablet, Take 500 mg by mouth 2 (two) times daily., Disp: , Rfl:  .  aspirin EC 81 MG tablet, Take 1 tablet (81 mg total) by mouth daily., Disp: 30 tablet, Rfl: 0 .  calcium carbonate (TUMS - DOSED IN MG ELEMENTAL CALCIUM) 500 MG chewable tablet, Chew 1 tablet by mouth as needed for indigestion or heartburn., Disp: , Rfl:  .  Calcium Citrate-Vitamin D (CALCIUM + D PO), Take by mouth daily.  , Disp: , Rfl:  .  magnesium gluconate (MAGONATE) 500 MG tablet, Take 500 mg by mouth daily., Disp: , Rfl:  .  Misc Natural Products (GLUCOSAMINE CHOND COMPLEX/MSM) TABS, Take by mouth daily., Disp: , Rfl:  .  olmesartan-hydrochlorothiazide (BENICAR HCT) 40-25 MG tablet, Take 1 tablet by mouth daily., Disp: 90 tablet, Rfl: 1 .  rosuvastatin (CRESTOR) 20 MG tablet, Take 1 tablet (20 mg total) by mouth daily., Disp: 90 tablet, Rfl: 1 .  Simethicone (GAS RELIEF PO), Take by mouth as needed., Disp: , Rfl:  .  Vitamin D, Cholecalciferol, 1000 units TABS, Take 1 tablet by mouth daily., Disp: , Rfl:  .  VITAMIN E PO, Take by mouth daily., Disp: , Rfl:  .  citalopram (CELEXA) 20 MG tablet, TAKE 1 TABLET BY MOUTH EVERY DAY (Patient not taking: Reported on 01/01/2020), Disp: 90 tablet, Rfl: 1  No Known Allergies  I personally  reviewed active problem list, medication list, allergies, family history, social history, health maintenance with the patient/caregiver today.   ROS  Constitutional: Negative for fever, positive for  weight change.  Respiratory: Negative for cough and shortness of breath.   Cardiovascular: Negative for chest pain or palpitations.  Gastrointestinal: Negative for abdominal pain, no bowel changes.  Musculoskeletal: Negative for gait problem or joint swelling.  Skin: Negative for rash.  Neurological: Negative for dizziness or headache.  No other specific complaints in a complete review of systems (except as listed in HPI above).  Objective  Vitals:   01/01/20 1142  BP: 126/78  Pulse: 88  Resp: 16  Temp: 98.1 F (36.7 C)  TempSrc: Oral  SpO2: 98%  Weight: 221 lb 1.6 oz (100.3 kg)  Height: 5\' 2"  (1.575 m)    Body mass index is 40.44 kg/m.  Physical Exam  Constitutional: Patient appears well-developed and well-nourished. Obese  No distress.  HEENT: head atraumatic, normocephalic, pupils equal and reactive to light, neck supple Cardiovascular: Normal rate, regular  rhythm and normal heart sounds.  No murmur heard. No BLE edema. Pulmonary/Chest: Effort normal and breath sounds normal. No respiratory distress. Abdominal: Soft.  There is no tenderness. Psychiatric: Patient has a normal mood and affect. behavior is normal. Judgment and thought content normal.  PHQ2/9: Depression screen [facility] 2/9 01/01/2020 06/29/2019 06/29/2019 12/29/2018 06/28/2018  Decreased Interest 0 1 0 0 0  Down, Depressed, Hopeless 0 2 0 0 0  PHQ - 2 Score 0 3 0 0 0  Altered sleeping 0 1 0 1 0  Tired, decreased energy 1 1 0 0 0  Change in appetite 1 0 0 2 1  Feeling bad or failure about yourself  0 0 0 0 0  Trouble concentrating 0 1 0 0 0  Moving slowly or fidgety/restless 0 0 0 0 0  Suicidal thoughts 0 0 0 0 0  PHQ-9 Score 2 6 0 3 1  Difficult doing work/chores Not difficult at all Somewhat difficult - Not difficult at all Not difficult at all  Some recent data might be hidden    phq 9 is negative    Fall Risk: Fall Risk  01/01/2020 06/29/2019 12/29/2018 06/28/2018 01/03/2018  Falls in the past year? 0 0 0 0 0  Number falls in past yr: 0 0 0 0 -  Injury with Fall? 0 0 0 0 -  Comment - - - - -     Functional Status Survey: Is the patient deaf or have difficulty hearing?: No Does the patient have difficulty seeing, even when wearing glasses/contacts?: No Does the patient have difficulty concentrating, remembering, or making decisions?: No Does the patient have difficulty walking or climbing stairs?: No Does the patient have difficulty dressing or bathing?: No Does the patient have difficulty doing errands alone such as visiting a doctor's office or shopping?: No    Assessment & Plan  1. Atherosclerosis of aorta (HCC)  - rosuvastatin (CRESTOR) 20 MG tablet; Take 1 tablet (20 mg total) by mouth daily.  Dispense: 90 tablet; Refill: 1  2. Colon cancer screening  - Ambulatory referral to Gastroenterology  3. Need for immunization against influenza  - Flu Vaccine  QUAD High Dose(Fluad)  4. Benign hypertension  - olmesartan-hydrochlorothiazide (BENICAR HCT) 40-25 MG tablet; Take 1 tablet by mouth daily.  Dispense: 90 tablet; Refill: 1 - COMPLETE METABOLIC PANEL WITH GFR - CBC with Differential/Platelet  5. Centrilobular emphysema ([place])  She is not ready to quit smoking,  but cutting down  6. Dyslipidemia  - rosuvastatin (CRESTOR) 20 MG tablet; Take 1 tablet (20 mg total) by mouth daily.  Dispense: 90 tablet; Refill: 1 - Lipid panel  7. Metabolic syndrome   8. Binge eating disorder   9. Perennial allergic rhinitis with seasonal variation   10. Depression, major, in remission [facility])  She is off medication and states doing well   11. Generalized anxiety disorder  - ALPRAZolam (XANAX) 0.5 MG tablet; Take 1 tablet (0.5 mg total) by mouth as needed for anxiety.  Dispense: 30 tablet; Refill: 0  12. Coronary artery calcification  - rosuvastatin (CRESTOR) 20 MG tablet; Take 1 tablet (20 mg total) by mouth daily.  Dispense: 90 tablet; Refill: 1  13. Hyperglycemia  - Hemoglobin A1c

## 2020-01-01 ENCOUNTER — Encounter: Payer: Self-pay | Admitting: Family Medicine

## 2020-01-01 ENCOUNTER — Ambulatory Visit: Payer: Federal, State, Local not specified - PPO | Admitting: Family Medicine

## 2020-01-01 ENCOUNTER — Other Ambulatory Visit: Payer: Self-pay

## 2020-01-01 VITALS — BP 126/78 | HR 88 | Temp 98.1°F | Resp 16 | Ht 62.0 in | Wt 221.1 lb

## 2020-01-01 DIAGNOSIS — J432 Centrilobular emphysema: Secondary | ICD-10-CM

## 2020-01-01 DIAGNOSIS — F5081 Binge eating disorder: Secondary | ICD-10-CM

## 2020-01-01 DIAGNOSIS — J3089 Other allergic rhinitis: Secondary | ICD-10-CM

## 2020-01-01 DIAGNOSIS — E8881 Metabolic syndrome: Secondary | ICD-10-CM

## 2020-01-01 DIAGNOSIS — Z1211 Encounter for screening for malignant neoplasm of colon: Secondary | ICD-10-CM | POA: Diagnosis not present

## 2020-01-01 DIAGNOSIS — I2584 Coronary atherosclerosis due to calcified coronary lesion: Secondary | ICD-10-CM

## 2020-01-01 DIAGNOSIS — Z23 Encounter for immunization: Secondary | ICD-10-CM | POA: Diagnosis not present

## 2020-01-01 DIAGNOSIS — I700 Atherosclerosis of aorta: Secondary | ICD-10-CM | POA: Diagnosis not present

## 2020-01-01 DIAGNOSIS — R739 Hyperglycemia, unspecified: Secondary | ICD-10-CM

## 2020-01-01 DIAGNOSIS — F411 Generalized anxiety disorder: Secondary | ICD-10-CM

## 2020-01-01 DIAGNOSIS — I2510 Atherosclerotic heart disease of native coronary artery without angina pectoris: Secondary | ICD-10-CM

## 2020-01-01 DIAGNOSIS — I10 Essential (primary) hypertension: Secondary | ICD-10-CM

## 2020-01-01 DIAGNOSIS — F325 Major depressive disorder, single episode, in full remission: Secondary | ICD-10-CM

## 2020-01-01 DIAGNOSIS — E785 Hyperlipidemia, unspecified: Secondary | ICD-10-CM

## 2020-01-01 DIAGNOSIS — J302 Other seasonal allergic rhinitis: Secondary | ICD-10-CM

## 2020-01-01 MED ORDER — OLMESARTAN MEDOXOMIL-HCTZ 40-25 MG PO TABS: 1.0000 | ORAL_TABLET | Freq: Every day | ORAL | 1 refills | Status: DC

## 2020-01-01 MED ORDER — ROSUVASTATIN CALCIUM 20 MG PO TABS: 20.0000 mg | ORAL_TABLET | Freq: Every day | ORAL | 1 refills | Status: DC

## 2020-01-01 MED ORDER — ALPRAZOLAM 0.5 MG PO TABS: 0.5000 mg | ORAL_TABLET | ORAL | 0 refills | Status: DC | PRN

## 2020-01-09 ENCOUNTER — Encounter: Payer: Self-pay | Admitting: General Practice

## 2020-01-14 ENCOUNTER — Ambulatory Visit: Payer: Federal, State, Local not specified - PPO | Attending: Internal Medicine

## 2020-01-14 DIAGNOSIS — Z23 Encounter for immunization: Secondary | ICD-10-CM

## 2020-01-14 NOTE — Progress Notes (Signed)
   Covid-19 Vaccination Clinic  Name:  Veronica Mendez    MRN: [id number] DOB: 02/19/1950  01/14/2020  [name] was observed post Covid-19 immunization for 15 minutes without incident. She was provided with Vaccine Information Sheet and instruction to access the V-Safe system.   [name] was instructed to call 911 with any severe reactions post vaccine: Marland Kitchen Difficulty breathing  . Swelling of face and throat  . A fast heartbeat  . A bad rash all over body  . Dizziness and weakness   Immunizations Administered    Name Date Dose VIS Date Route   Pfizer COVID-19 Vaccine 01/14/2020  1:06 PM 0.3 mL 12/05/2019 Intramuscular   Manufacturer: [place]   Lot: [id number]   [place]: [id number]

## 2020-04-24 ENCOUNTER — Telehealth: Payer: Self-pay

## 2020-04-24 NOTE — Telephone Encounter (Signed)
Patient contacted and scheduled for lung screening CT scan (05/28/20 @ 11:00 am). Still smoker at less than 1/2 a ppd. No insurance changes.

## 2020-04-25 ENCOUNTER — Other Ambulatory Visit: Payer: Self-pay | Admitting: *Deleted

## 2020-04-25 DIAGNOSIS — Z122 Encounter for screening for malignant neoplasm of respiratory organs: Secondary | ICD-10-CM

## 2020-04-25 DIAGNOSIS — F17200 Nicotine dependence, unspecified, uncomplicated: Secondary | ICD-10-CM

## 2020-04-25 DIAGNOSIS — Z87891 Personal history of nicotine dependence: Secondary | ICD-10-CM

## 2020-04-25 NOTE — Progress Notes (Signed)
Contacted and scheduled for annual lung screening scan. Patient is a current smoker with a 51.75 pack year history.

## 2020-05-28 ENCOUNTER — Ambulatory Visit
Admission: RE | Admit: 2020-05-28 | Discharge: 2020-05-28 | Disposition: A | Discharge status: Home / Self Care | Payer: Federal, State, Local not specified - PPO | Source: Ambulatory Visit | Attending: Oncology | Admitting: Oncology

## 2020-05-28 ENCOUNTER — Other Ambulatory Visit: Payer: Self-pay

## 2020-05-28 DIAGNOSIS — Z122 Encounter for screening for malignant neoplasm of respiratory organs: Secondary | ICD-10-CM | POA: Diagnosis present

## 2020-05-28 DIAGNOSIS — Z87891 Personal history of nicotine dependence: Secondary | ICD-10-CM | POA: Diagnosis not present

## 2020-05-28 DIAGNOSIS — F17200 Nicotine dependence, unspecified, uncomplicated: Secondary | ICD-10-CM

## 2020-06-03 ENCOUNTER — Encounter: Payer: Self-pay | Admitting: *Deleted

## 2020-06-23 ENCOUNTER — Emergency Department: Payer: Federal, State, Local not specified - PPO

## 2020-06-23 ENCOUNTER — Other Ambulatory Visit: Payer: Self-pay

## 2020-06-23 ENCOUNTER — Encounter: Payer: Self-pay | Admitting: *Deleted

## 2020-06-23 ENCOUNTER — Emergency Department
Admission: EM | Admit: 2020-06-23 | Discharge: 2020-06-24 | Disposition: A | Discharge status: Home / Self Care | Payer: Federal, State, Local not specified - PPO | Attending: Emergency Medicine | Admitting: Emergency Medicine

## 2020-06-23 DIAGNOSIS — I10 Essential (primary) hypertension: Secondary | ICD-10-CM | POA: Diagnosis not present

## 2020-06-23 DIAGNOSIS — F17210 Nicotine dependence, cigarettes, uncomplicated: Secondary | ICD-10-CM | POA: Diagnosis not present

## 2020-06-23 DIAGNOSIS — R410 Disorientation, unspecified: Secondary | ICD-10-CM | POA: Insufficient documentation

## 2020-06-23 DIAGNOSIS — Z7982 Long term (current) use of aspirin: Secondary | ICD-10-CM | POA: Diagnosis not present

## 2020-06-23 DIAGNOSIS — R42 Dizziness and giddiness: Secondary | ICD-10-CM | POA: Diagnosis not present

## 2020-06-23 DIAGNOSIS — Z79899 Other long term (current) drug therapy: Secondary | ICD-10-CM | POA: Diagnosis not present

## 2020-06-23 LAB — CBC
HCT: 41.3 % (ref 36.0–46.0)
Hemoglobin: 14.3 g/dL (ref 12.0–15.0)
MCH: 31.4 pg (ref 26.0–34.0)
MCHC: 34.6 g/dL (ref 30.0–36.0)
MCV: 90.6 fL (ref 80.0–100.0)
Platelets: 290 K/uL (ref 150–400)
RBC: 4.56 MIL/uL (ref 3.87–5.11)
RDW: 13.3 % (ref 11.5–15.5)
WBC: 10 K/uL (ref 4.0–10.5)
nRBC: 0 % (ref 0.0–0.2)

## 2020-06-23 LAB — BASIC METABOLIC PANEL
Anion gap: 9 (ref 5–15)
BUN: 20 mg/dL (ref 8–23)
CO2: 23 mmol/L (ref 22–32)
Calcium: 9.4 mg/dL (ref 8.9–10.3)
Chloride: 105 mmol/L (ref 98–111)
Creatinine, Ser: 0.67 mg/dL (ref 0.44–1.00)
GFR, Estimated: >60 mL/min (ref >60)
Glucose, Bld: 102 mg/dL — ABNORMAL HIGH (ref 70–99)
Potassium: 3.9 mmol/L (ref 3.5–5.1)
Sodium: 137 mmol/L (ref 135–145)

## 2020-06-23 LAB — TROPONIN I (HIGH SENSITIVITY): Troponin I (High Sensitivity): 4 ng/L (ref <18)

## 2020-06-23 NOTE — ED Provider Notes (Signed)
[facility] Emergency Department Provider Note   ____________________________________________   I have reviewed the triage vital signs and the nursing notes.   HISTORY  Chief Complaint Dizziness   History limited by: Not Limited   HPI Veronica Mendez is a 70 y.o. female who presents to the emergency department today because of concerns for an episode of dizziness as well as the sensation of difficulty concentrating.  The patient states that the dizziness occurred a few days ago.  She was just sitting down when all of a sudden she felt very dizzy and passed out.  She says since that time she has not had as significant of a dizzy episode although has continued to feel confused and somewhat foggy.  She states that she has in the past had intermittent episodes of dizziness.  Patient also has felt intermittent episodes of her heart racing.    Records reviewed. Per medical record review patient has a history of HLD  Past Medical History:  Diagnosis Date  . Depression   . Hyperlipidemia   . Obesity, Class III, BMI 40-49.9 (morbid obesity) ([place]) 11/10/2015    Patient Active Problem List   Diagnosis Date Noted  . Morbid obesity, unspecified obesity type ([place]) 06/28/2018  . Depression, major, recurrent, mild ([place]) 06/28/2018  . Coronary artery calcification 09/06/2016  . Lung nodule, solitary 07/30/2016  . Atherosclerosis of aorta ([place]) 07/30/2016  . Centrilobular emphysema ([place]) 07/30/2016  . Personal history of tobacco use, presenting hazards to health 07/30/2016  . Binge eating disorder 11/10/2015  . Hyperglycemia 07/09/2015  . Radiculitis of left cervical region 10/28/2014  . Vitamin D deficiency 10/26/2014  . Lumbosacral spondylosis without myelopathy 10/26/2014  . Extreme obesity 10/26/2014  . De Quervain's disease (radial styloid tenosynovitis) 10/26/2014  . Nephrolithiasis 10/23/2014  . Trigger finger, right 10/23/2014  . Carpal tunnel syndrome  10/23/2014  . Generalized anxiety disorder 10/23/2014  . Major depression ([place]) 10/23/2014  . Fibromyalgia 10/23/2014  . Perennial allergic rhinitis 10/23/2014  . Dyslipidemia 10/23/2014  . Benign hypertension 10/23/2014  . Morbid drug-induced obesity 10/23/2014  . Insomnia 10/23/2014  . Metabolic syndrome 96/29/5284  . Tobacco use disorder 10/23/2014  . Menopause syndrome 10/23/2014  . Lumbago 10/23/2014  . Osteoarthritis 10/23/2014  . Atrophic vaginitis 10/23/2014    Past Surgical History:  Procedure Laterality Date  . ABDOMINAL HYSTERECTOMY Bilateral 1988  . KNEE SURGERY Left 2014    Prior to Admission medications   Medication Sig Start Date End Date Taking? Authorizing Provider  ALPRAZolam Duanne Moron) 0.5 MG tablet Take 1 tablet (0.5 mg total) by mouth as needed for anxiety. 01/01/20   Steele Sizer, MD  ascorbic acid (VITAMIN C) 500 MG tablet Take 500 mg by mouth 2 (two) times daily.    [provider]  aspirin EC 81 MG tablet Take 1 tablet (81 mg total) by mouth daily. 09/06/16   Steele Sizer, MD  calcium carbonate (TUMS - DOSED IN MG ELEMENTAL CALCIUM) 500 MG chewable tablet Chew 1 tablet by mouth as needed for indigestion or heartburn.    [provider]  Calcium Citrate-Vitamin D (CALCIUM + D PO) Take by mouth daily.     [provider]  magnesium gluconate (MAGONATE) 500 MG tablet Take 500 mg by mouth daily.    [provider]  Misc Natural Products (GLUCOSAMINE CHOND COMPLEX/MSM) TABS Take by mouth daily.    [provider]  olmesartan-hydrochlorothiazide (BENICAR HCT) 40-25 MG tablet Take 1 tablet by mouth daily. 01/01/20  Steele Sizer, MD  rosuvastatin (CRESTOR) 20 MG tablet Take 1 tablet (20 mg total) by mouth daily. 01/01/20   Steele Sizer, MD  Simethicone (GAS RELIEF PO) Take by mouth as needed.    [provider]  Vitamin D, Cholecalciferol, 1000 units TABS Take 1 tablet by mouth daily.    [provider]  VITAMIN E PO Take by mouth daily.    [provider]    Allergies Patient has no known allergies.  Family History  Problem Relation Age of Onset  . Osteoarthritis Mother   . Stroke Mother   . Heart disease Father   . Peripheral Artery Disease Sister   . Diabetes Brother   . Heart attack Brother   . Diabetes Maternal Grandfather   . Heart attack Paternal Uncle   . Heart attack Paternal Uncle   . Heart attack Paternal Uncle   . Heart attack Paternal Uncle   . Heart attack Paternal Uncle   . Heart attack Paternal Uncle   . Heart attack Paternal Uncle   . Heart attack Brother   . Atrial fibrillation Brother   . Heart Problems Brother   . Heart attack Brother   . Stroke Brother   . Heart disease Brother   . Heart disease Other     Social History Social History   Tobacco Use  . Smoking status: Current Every Day Smoker    Packs/day: 1.00    Years: 50.00    Pack years: 50.00    Types: Cigarettes    Start date: 05/26/2016  . Smokeless tobacco: Never Used  . Tobacco comment: down to 5 cigarettes daily   Vaping Use  . Vaping Use: Never used  Substance Use Topics  . Alcohol use: Not Currently    Alcohol/week: 0.0 standard drinks  . Drug use: No    Review of Systems Constitutional: No fever/chills Eyes: No visual changes. ENT: No sore throat. Cardiovascular: Positive for racing heart feeling.  Respiratory: Denies shortness of breath. Gastrointestinal: No abdominal pain.  No nausea, no vomiting.  No diarrhea.   Genitourinary: Negative for dysuria. Musculoskeletal: Negative for back pain. Skin: Negative for rash. Neurological: Positive for dizziness, heavy feeling. Positive for syncope.  ____________________________________________   PHYSICAL EXAM:  VITAL SIGNS: ED Triage Vitals  Enc Vitals Group     BP 06/23/20 2059 118/79     Pulse Rate 06/23/20 2059 79     Resp 06/23/20 2059 20     Temp 06/23/20 2059 97.7 F (36.5 C)     Temp  Source 06/23/20 2059 Oral     SpO2 06/23/20 2059 99 %     Weight 06/23/20 2055 200 lb (90.7 kg)     Height 06/23/20 2055 5\' 3"  (1.6 m)     Head Circumference --      Peak Flow --      Pain Score 06/23/20 2055 0   Constitutional: Alert and oriented.  Eyes: Conjunctivae are normal.  ENT      Head: Normocephalic and atraumatic.      Nose: No congestion/rhinnorhea.      Mouth/Throat: Mucous membranes are moist.      Neck: No stridor. Hematological/Lymphatic/Immunilogical: No cervical lymphadenopathy. Cardiovascular: Normal rate, regular rhythm.  No murmurs, rubs, or gallops.  Respiratory: Normal respiratory effort without tachypnea nor retractions. Breath sounds are clear and equal bilaterally. No wheezes/rales/rhonchi. Gastrointestinal: Soft and non tender. No rebound. No guarding.  Genitourinary: Deferred Musculoskeletal: Normal range of motion in all extremities. No  lower extremity edema. Neurologic:  Normal speech and language. No gross focal neurologic deficits are appreciated.  Skin:  Skin is warm, dry and intact. No rash noted. Psychiatric: Mood and affect are normal. Speech and behavior are normal. Patient exhibits appropriate insight and judgment.  ____________________________________________    LABS (pertinent positives/negatives)  CBC wbc 10.0, hgb 14.3, plt 290 Trop hs 4 BMP wnl except glu 102 ____________________________________________   EKG  I, Nance Pear, attending physician, personally viewed and interpreted this EKG  EKG Time: 2056 Rate: 87 Rhythm: normal sinus rhythm Axis: normal Intervals: qtc 442 QRS: narrow ST changes: no st elevation Impression: normal ekg ____________________________________________    RADIOLOGY  CT head No acute intracranial abnormality  CXR No active cardiopulmonary disease  ____________________________________________   PROCEDURES  Procedures  ____________________________________________   INITIAL  IMPRESSION / ASSESSMENT AND PLAN / ED COURSE  Pertinent labs & imaging results that were available during my care of the patient were reviewed by me and considered in my medical decision making (see chart for details).   Patient presented to the emergency department today because of concerns for a dizzy episode and continued difficulty with concentration and thought.  Patient states she also had syncope with a dizzy episode.  She without any acute abnormalities.  Blood work without any obvious etiology of the patient's continued symptoms.  Electrolytes without concerning findings.  Given dizziness and continued mental issues will obtain MRI to further evaluate for possible stroke.  ____________________________________________   FINAL CLINICAL IMPRESSION(S) / ED DIAGNOSES  Final diagnoses:  Dizziness  Confusion     Note: This dictation was prepared with Dragon dictation. Any transcriptional errors that result from this process are unintentional     Nance Pear, MD 06/23/20 2314

## 2020-06-23 NOTE — ED Triage Notes (Addendum)
Pt reports dizzy spells since last week.  Pt reports passing out last week.  Pt also has chest heaviness.  No sob.  No cough or fevers.  Pt reports h/a, feeling fuzzy in the head.  Pt  Alert  Speech clear.

## 2020-06-24 ENCOUNTER — Emergency Department: Payer: Federal, State, Local not specified - PPO

## 2020-06-24 NOTE — ED Provider Notes (Signed)
Patient received in signout pending follow-up MRI.  MRI stable with no evidence of acute abnormality.  Patient feels well.  Does appear stable and appropriate for outpatient follow-up.   Merlyn Lot, MD 06/24/20 [phone]

## 2020-06-27 ENCOUNTER — Ambulatory Visit: Payer: Federal, State, Local not specified - PPO | Admitting: Cardiovascular Disease

## 2020-06-27 ENCOUNTER — Other Ambulatory Visit: Payer: Self-pay

## 2020-06-27 ENCOUNTER — Encounter: Payer: Self-pay | Admitting: Cardiovascular Disease

## 2020-06-27 VITALS — BP 124/80 | HR 81 | Ht 63.0 in | Wt 213.0 lb

## 2020-06-27 DIAGNOSIS — I2584 Coronary atherosclerosis due to calcified coronary lesion: Secondary | ICD-10-CM | POA: Diagnosis not present

## 2020-06-27 DIAGNOSIS — I2510 Atherosclerotic heart disease of native coronary artery without angina pectoris: Secondary | ICD-10-CM

## 2020-06-27 DIAGNOSIS — I700 Atherosclerosis of aorta: Secondary | ICD-10-CM

## 2020-06-27 NOTE — Patient Instructions (Addendum)
Smoking cessation  Medication Instructions:   Take crestor daily, not every other day  If you need a refill on your cardiac medications before your next appointment, please call your pharmacy.    Lab work: No new labs needed   If you have labs (blood work) drawn today and your tests are completely normal, you will receive your results only by: Marland Kitchen MyChart Message (if you have MyChart) OR . A paper copy in the mail If you have any lab test that is abnormal or we need to change your treatment, we will call you to review the results.   Testing/Procedures: No new testing needed   Follow-Up: At [facility], you and your health needs are our priority.  As part of our continuing mission to provide you with exceptional heart care, we have created designated Provider Care Teams.  These Care Teams include your primary Cardiologist (physician) and Advanced Practice Providers (APPs -  Physician Assistants and Nurse Practitioners) who all work together to provide you with the care you need, when you need it.  . You will need a follow up appointment as needed  . Providers on your designated Care Team:   . Murray Hodgkins, NP . Christell Faith, PA-C . Marrianne Mood, PA-C  Any Other Special Instructions Will Be Listed Below (If Applicable).  COVID-19 Vaccine Information can be found at: ShippingScam.co.uk For questions related to vaccine distribution or appointments, please email vaccine@[email or web address] .com or call [phone].     Steps to Quit Smoking Smoking tobacco is the leading cause of preventable death. It can affect almost every organ in the body. Smoking puts you and people around you at risk for many serious, long-lasting (chronic) diseases. Quitting smoking can be hard, but it is one of the best things that you can do for your health. It is never too late to quit. How do I get ready to quit? When you decide to quit smoking,  make a plan to help you succeed. Before you quit:  Pick a date to quit. Set a date within the next 2 weeks to give you time to prepare.  Write down the reasons why you are quitting. Keep this list in places where you will see it often.  Tell your family, friends, and co-workers that you are quitting. Their support is important.  Talk with your doctor about the choices that may help you quit.  Find out if your health insurance will pay for these treatments.  Know the people, places, things, and activities that make you want to smoke (triggers). Avoid them. What first steps can I take to quit smoking?  Throw away all cigarettes at home, at work, and in your car.  Throw away the things that you use when you smoke, such as ashtrays and lighters.  Clean your car. Make sure to empty the ashtray.  Clean your home, including curtains and carpets. What can I do to help me quit smoking? Talk with your doctor about taking medicines and seeing a counselor at the same time. You are more likely to succeed when you do both.  If you are pregnant or breastfeeding, talk with your doctor about counseling or other ways to quit smoking. Do not take medicine to help you quit smoking unless your doctor tells you to do so. To quit smoking: Quit right away  Quit smoking totally, instead of slowly cutting back on how much you smoke over a period of time.  Go to counseling. You are more likely to quit if  you go to counseling sessions regularly. Take medicine You may take medicines to help you quit. Some medicines need a prescription, and some you can buy over-the-counter. Some medicines may contain a drug called nicotine to replace the nicotine in cigarettes. Medicines may:  Help you to stop having the desire to smoke (cravings).  Help to stop the problems that come when you stop smoking (withdrawal symptoms). Your doctor may ask you to use:  Nicotine patches, gum, or lozenges.  Nicotine inhalers or  sprays.  Non-nicotine medicine that is taken by mouth. Find resources Find resources and other ways to help you quit smoking and remain smoke-free after you quit. These resources are most helpful when you use them often. They include:  Online chats with a Social worker.  Phone quitlines.  Printed Furniture conservator/restorer.  Support groups or group counseling.  Text messaging programs.  Mobile phone apps. Use apps on your mobile phone or tablet that can help you stick to your quit plan. There are many free apps for mobile phones and tablets as well as websites. Examples include Quit Guide from the [employer] and smokefree.gov   What things can I do to make it easier to quit?  Talk to your family and friends. Ask them to support and encourage you.  Call a phone quitline (1-800-QUIT-NOW), reach out to support groups, or work with a Social worker.  Ask people who smoke to not smoke around you.  Avoid places that make you want to smoke, such as: ? Bars. ? Parties. ? Smoke-break areas at work.  Spend time with people who do not smoke.  Lower the stress in your life. Stress can make you want to smoke. Try these things to help your stress: ? Getting regular exercise. ? Doing deep-breathing exercises. ? Doing yoga. ? Meditating. ? Doing a body scan. To do this, close your eyes, focus on one area of your body at a time from head to toe. Notice which parts of your body are tense. Try to relax the muscles in those areas.   How will I feel when I quit smoking? Day 1 to 3 weeks Within the first 24 hours, you may start to have some problems that come from quitting tobacco. These problems are very bad 2-3 days after you quit, but they do not often last for more than 2-3 weeks. You may get these symptoms:  Mood swings.  Feeling restless, nervous, angry, or annoyed.  Trouble concentrating.  Dizziness.  Strong desire for high-sugar foods and nicotine.  Weight gain.  Trouble pooping  (constipation).  Feeling like you may vomit (nausea).  Coughing or a sore throat.  Changes in how the medicines that you take for other issues work in your body.  Depression.  Trouble sleeping (insomnia). Week 3 and afterward After the first 2-3 weeks of quitting, you may start to notice more positive results, such as:  Better sense of smell and taste.  Less coughing and sore throat.  Slower heart rate.  Lower blood pressure.  Clearer skin.  Better breathing.  Fewer sick days. Quitting smoking can be hard. Do not give up if you fail the first time. Some people need to try a few times before they succeed. Do your best to stick to your quit plan, and talk with your doctor if you have any questions or concerns. Summary  Smoking tobacco is the leading cause of preventable death. Quitting smoking can be hard, but it is one of the best things that you can do  for your health.  When you decide to quit smoking, make a plan to help you succeed.  Quit smoking right away, not slowly over a period of time.  When you start quitting, seek help from your doctor, family, or friends. This information is not intended to replace advice given to you by your health care provider. Make sure you discuss any questions you have with your health care provider. Document Revised: 10/27/2018 Document Reviewed: 04/22/2018 Elsevier Patient Education  [place].

## 2020-06-27 NOTE — Progress Notes (Signed)
Cardiology Office Note  Date:  06/27/2020   ID:  Veronica Mendez, DOB 03/08/1950, MRN [id number]  PCP:  Veronica Sizer, MD   Chief Complaint  Patient presents with  . New Patient (Initial Visit)    Self referral for SOB and Heart Racing. Patient is under a lot of stress. Meds reviewed verbally with patient.     HPI:  Veronica Mendez is a 70 year old woman with past medical history of  . Binge Eating Disorder  . Obesity  . Nicotine Dependence, quit 05/2016, age 83  . Hypertension  . Hyperlipidemia, treated  Still smoking Previously seen in August 2018 for aortic and coronary atherosclerosis, chest pain  Prior screening CT scans reviewed mild plaque in the left subclavian/innominate, mild plaquing in the aortic arch, mild plaque in the descending aorta, mild calcified plaque in the LAD and left circumflex  On aspirin and Crestor  In the ER 06/23/2020: Dizziness,difficulty concentrating, passed out Stress at home, husband with medical issues Major anxiety, fear of getting in the car Taking xanax Afraid to walk to leave the house  CT chest screening study Mild to moderate aorta atherosclerosis,  Mild diffuse coronary calcium  Taking crestor QOD  No lipids in 2 years  EKG personally reviewed by myself on todays visit Shows normal sinus rhythm rate 88 bpm no significant ST or T-wave changes   PMH:   has a past medical history of Depression, Hyperlipidemia, and Obesity, Class III, BMI 40-49.9 (morbid obesity) ([place]) (11/10/2015).  PSH:    Past Surgical History:  Procedure Laterality Date  . ABDOMINAL HYSTERECTOMY Bilateral 1988  . KNEE SURGERY Left 2014    Current Outpatient Medications  Medication Sig Dispense Refill  . ALPRAZolam (XANAX) 0.5 MG tablet Take 1 tablet (0.5 mg total) by mouth as needed for anxiety. 30 tablet 0  . ascorbic acid (VITAMIN C) 500 MG tablet Take 500 mg by mouth 2 (two) times daily.    . calcium carbonate (TUMS - DOSED IN MG ELEMENTAL CALCIUM) 500  MG chewable tablet Chew 1 tablet by mouth as needed for indigestion or heartburn.    . magnesium gluconate (MAGONATE) 500 MG tablet Take 500 mg by mouth daily.    Marland Kitchen olmesartan-hydrochlorothiazide (BENICAR HCT) 40-25 MG tablet Take 1 tablet by mouth daily. 90 tablet 1  . rosuvastatin (CRESTOR) 20 MG tablet Take 1 tablet (20 mg total) by mouth daily. 90 tablet 1  . Vitamin D, Cholecalciferol, 1000 units TABS Take 1 tablet by mouth daily.    Marland Kitchen VITAMIN E PO Take by mouth daily.     No current facility-administered medications for this visit.     Allergies:   Patient has no known allergies.   Social History:  The patient  reports that she has been smoking cigarettes. She started smoking about 4 years ago. She has a 50.00 pack-year smoking history. She has never used smokeless tobacco. She reports previous alcohol use. She reports that she does not use drugs.   Family History:   family history includes Atrial fibrillation in her brother; Diabetes in her brother and maternal grandfather; Heart Problems in her brother; Heart attack in her brother, brother, brother, paternal uncle, paternal uncle, paternal uncle, paternal uncle, paternal uncle, paternal uncle, and paternal uncle; Heart disease in her brother, father, and another family member; Osteoarthritis in her mother; Peripheral Artery Disease in her sister; Stroke in her brother and mother.    Review of Systems: Review of Systems  Constitutional: Negative.   Respiratory: Negative.  Cardiovascular: Positive for chest pain.  Gastrointestinal: Negative.   Musculoskeletal: Negative.   Neurological: Negative.   Psychiatric/Behavioral: Negative.   All other systems reviewed and are negative.    PHYSICAL EXAM: VS:  BP 124/80 (BP Location: Right Arm, Patient Position: Sitting, Cuff Size: Normal)   Pulse 81   Ht 5\' 3"  (1.6 m)   Wt 213 lb (96.6 kg)   SpO2 97%   BMI 37.73 kg/m  , BMI Body mass index is 37.73 kg/m. GEN: Well nourished, well  developed, in no acute distress  HEENT: normal  Neck: no JVD, carotid bruits, or masses Cardiac: RRR; no murmurs, rubs, or gallops,no edema  Respiratory:  clear to auscultation bilaterally, normal work of breathing GI: soft, nontender, nondistended, + BS MS: no deformity or atrophy  Skin: warm and dry, no rash Neuro:  Strength and sensation are intact Psych: euthymic mood, full affect    Recent Labs: 06/23/2020: BUN 20; Creatinine, Ser 0.67; Hemoglobin 14.3; Platelets 290; Potassium 3.9; Sodium 137    Lipid Panel Lab Results  Component Value Date   CHOL 174 08/23/2018   HDL 64 08/23/2018   LDLCALC 91 08/23/2018   TRIG 96 08/23/2018      Wt Readings from Last 3 Encounters:  06/27/20 213 lb (96.6 kg)  06/23/20 200 lb (90.7 kg)  05/28/20 200 lb (90.7 kg)       ASSESSMENT AND PLAN:  Coronary artery calcification - Plan: EKG 12-Lead Mild coronary calcifications on CT scan No significant symptoms concerning for angina Would treat with aggressive cholesterol management, smoking cessation goal LDL less than 70, Crestor daily, may need to add Zetia  Atherosclerosis of aorta ([place]) - Plan: EKG 12-Lead  aortic atherosclerosis, getting worse Still smoking, not compliant with  crestor daily  Benign hypertension - Blood pressure is well controlled on today's visit. No changes made to the medications.  Dyslipidemia - Plan: EKG 12-Lead Suggested she take crestor daily, not QOD Goal LD <70 Consider zetia 10mg   if not at goal  Atypical chest pain Atypical Secondary to stress Need anxiety medication? Take xanax    Total encounter time more than 60 minutes  Greater than 50% was spent in counseling and coordination of care with the patient    No orders of the defined types were placed in this encounter.    Signed, Esmond Plants, M.D., Ph.D. 06/27/2020  [place], [place]

## 2020-07-04 ENCOUNTER — Encounter: Payer: Self-pay | Admitting: Family Medicine

## 2020-07-04 ENCOUNTER — Other Ambulatory Visit: Payer: Self-pay

## 2020-07-04 ENCOUNTER — Ambulatory Visit: Payer: Federal, State, Local not specified - PPO | Admitting: Family Medicine

## 2020-07-04 ENCOUNTER — Encounter: Payer: Federal, State, Local not specified - PPO | Admitting: Family Medicine

## 2020-07-04 VITALS — BP 124/76 | HR 86 | Temp 98.2°F | Resp 16 | Ht 63.0 in | Wt 211.0 lb

## 2020-07-04 DIAGNOSIS — F330 Major depressive disorder, recurrent, mild: Secondary | ICD-10-CM

## 2020-07-04 DIAGNOSIS — I2510 Atherosclerotic heart disease of native coronary artery without angina pectoris: Secondary | ICD-10-CM

## 2020-07-04 DIAGNOSIS — F5081 Binge eating disorder: Secondary | ICD-10-CM

## 2020-07-04 DIAGNOSIS — Z1231 Encounter for screening mammogram for malignant neoplasm of breast: Secondary | ICD-10-CM

## 2020-07-04 DIAGNOSIS — E785 Hyperlipidemia, unspecified: Secondary | ICD-10-CM

## 2020-07-04 DIAGNOSIS — I700 Atherosclerosis of aorta: Secondary | ICD-10-CM

## 2020-07-04 DIAGNOSIS — E8881 Metabolic syndrome: Secondary | ICD-10-CM | POA: Diagnosis not present

## 2020-07-04 DIAGNOSIS — Z1211 Encounter for screening for malignant neoplasm of colon: Secondary | ICD-10-CM

## 2020-07-04 DIAGNOSIS — I2584 Coronary atherosclerosis due to calcified coronary lesion: Secondary | ICD-10-CM

## 2020-07-04 DIAGNOSIS — F411 Generalized anxiety disorder: Secondary | ICD-10-CM

## 2020-07-04 DIAGNOSIS — Z79899 Other long term (current) drug therapy: Secondary | ICD-10-CM

## 2020-07-04 DIAGNOSIS — J432 Centrilobular emphysema: Secondary | ICD-10-CM | POA: Diagnosis not present

## 2020-07-04 DIAGNOSIS — I10 Essential (primary) hypertension: Secondary | ICD-10-CM

## 2020-07-04 DIAGNOSIS — R739 Hyperglycemia, unspecified: Secondary | ICD-10-CM

## 2020-07-04 DIAGNOSIS — F5105 Insomnia due to other mental disorder: Secondary | ICD-10-CM

## 2020-07-04 MED ORDER — HYDROXYZINE HCL 10 MG PO TABS
10.0000 mg | ORAL_TABLET | Freq: Every evening | ORAL | 0 refills | Status: DC
Start: 2020-07-04 — End: 2020-10-06

## 2020-07-04 MED ORDER — ALPRAZOLAM 0.5 MG PO TABS: 0.5000 mg | ORAL_TABLET | ORAL | 0 refills | Status: DC | PRN

## 2020-07-04 MED ORDER — ESCITALOPRAM OXALATE 10 MG PO TABS: 10.0000 mg | ORAL_TABLET | Freq: Every day | ORAL | 0 refills | Status: DC

## 2020-07-04 NOTE — Progress Notes (Signed)
Name: Veronica Mendez   MRN: [id number]    DOB: 05/26/1950   Date:07/04/2020       Progress Note  Subjective  Chief Complaint  Follow Up  HPI  HTN: bpis at goal. Advised her tocontinue Benicar HCTZfor bp.No chest pain, dizziness, palpitation or decrease in exercise tolerance Recently seen by Dr. Rockey Situ bp is at goal . She had an episode of syncope and went to [facility], she recalls episodes prior and saw Dr. Rockey Situ, she states mind feels foggy, likely from anxiety and depression   Hyperlipidemia: She was found to have calcification of coronary vessels found on CT chest,she is on high dose statin now,last LDL below 100, we ordered labs back in Nov but she did not have it done, advised her to have it done today   Major Depression/GAD:shewas onLexapro for many years, unable to take Cymbaltawe switched to Citalopram and did not help, sheiswason Effexor since 02/2017 she had a relapse and May 2019 we increased dose, she started to feel better, started to do some meditation and establish boundaries and started to feel better. She has been under a lot of stress , her husbands health has been going down, diagnosed with Afib also a recent stroke, parkinson's and also lung cancer . She states it has been very stressful. She has been praying, meditating, but she took some alprazolam for panic attacks intermittently. She is willing to start back on Lexapro   FMS: sheis eating healthier and states pain is much better. She does not want to take Lyrica or Gabapentin, going to chiropractor for neck pain, she still on a low carb diet and symptoms have been controlled   Obesity: She started Vyvanse back inApril 2018at a weight of 252 lbs.She likes to eat food - not necessarily desserts. She never tried laxatives or throwing up to help with weight loss. She hadlost32lbs white on Vyvanse but stopped taking it and gained some weight. She has lost 10 lbs since her last visit 6 months ago, she  states she thinks due to stress   Metabolic Syndrome: she has polyphagia, no polydipsia or polyuria. She had elevated fasting insulin, she has lost some weight. We will recheck A1C today   Centrolobular emphysema: she has mild dry cough in am's, but no SOB or wheezing.She states worse when laying down , she does not want to use an inhaler. She is still smoking, but still on about 5 cigarettes daily.   Atherosclerosis , aorta : discussed results from CT chest done,she is on high dose Crestor andaspirin, seen by cardiologist and given reassurance. She also has significant family history of heart disease,but denies chest pain. She was advised to have labs rechecked goal is below 70  Lung nodule: she is still smoking down to 5 cigarettes daily but unable to quit, last CT lung was 04/2020, she is on yearly follow ups   Patient Active Problem List   Diagnosis Date Noted  . Morbid obesity, unspecified obesity type ([place]) 06/28/2018  . Depression, major, recurrent, mild ([place]) 06/28/2018  . Coronary artery calcification 09/06/2016  . Lung nodule, solitary 07/30/2016  . Atherosclerosis of aorta ([place]) 07/30/2016  . Centrilobular emphysema ([place]) 07/30/2016  . Personal history of tobacco use, presenting hazards to health 07/30/2016  . Binge eating disorder 11/10/2015  . Hyperglycemia 07/09/2015  . Radiculitis of left cervical region 10/28/2014  . Vitamin D deficiency 10/26/2014  . Lumbosacral spondylosis without myelopathy 10/26/2014  . Extreme obesity 10/26/2014  . De Quervain's disease (radial  styloid tenosynovitis) 10/26/2014  . Nephrolithiasis 10/23/2014  . Trigger finger, right 10/23/2014  . Carpal tunnel syndrome 10/23/2014  . Generalized anxiety disorder 10/23/2014  . Major depression ([place]) 10/23/2014  . Fibromyalgia 10/23/2014  . Perennial allergic rhinitis 10/23/2014  . Dyslipidemia 10/23/2014  . Benign hypertension 10/23/2014  . Morbid drug-induced obesity 10/23/2014  .  Insomnia 10/23/2014  . Metabolic syndrome 31/54/0086  . Tobacco use disorder 10/23/2014  . Menopause syndrome 10/23/2014  . Lumbago 10/23/2014  . Osteoarthritis 10/23/2014  . Atrophic vaginitis 10/23/2014    Past Surgical History:  Procedure Laterality Date  . ABDOMINAL HYSTERECTOMY Bilateral 1988  . KNEE SURGERY Left 2014    Family History  Problem Relation Age of Onset  . Osteoarthritis Mother   . Stroke Mother   . Heart disease Father   . Peripheral Artery Disease Sister   . Diabetes Brother   . Heart attack Brother   . Diabetes Maternal Grandfather   . Heart attack Paternal Uncle   . Heart attack Paternal Uncle   . Heart attack Paternal Uncle   . Heart attack Paternal Uncle   . Heart attack Paternal Uncle   . Heart attack Paternal Uncle   . Heart attack Paternal Uncle   . Heart attack Brother   . Atrial fibrillation Brother   . Heart Problems Brother   . Heart attack Brother   . Stroke Brother   . Heart disease Brother   . Heart disease Other     Social History   Tobacco Use  . Smoking status: Current Every Day Smoker    Packs/day: 1.00    Years: 50.00    Pack years: 50.00    Types: Cigarettes    Start date: 05/26/2016  . Smokeless tobacco: Never Used  . Tobacco comment: down to 5 cigarettes daily   Substance Use Topics  . Alcohol use: Not Currently    Alcohol/week: 0.0 standard drinks     Current Outpatient Medications:  .  ALPRAZolam (XANAX) 0.5 MG tablet, Take 1 tablet (0.5 mg total) by mouth as needed for anxiety., Disp: 30 tablet, Rfl: 0 .  ascorbic acid (VITAMIN C) 500 MG tablet, Take 500 mg by mouth 2 (two) times daily., Disp: , Rfl:  .  calcium carbonate (TUMS - DOSED IN MG ELEMENTAL CALCIUM) 500 MG chewable tablet, Chew 1 tablet by mouth as needed for indigestion or heartburn., Disp: , Rfl:  .  magnesium gluconate (MAGONATE) 500 MG tablet, Take 500 mg by mouth daily., Disp: , Rfl:  .  olmesartan-hydrochlorothiazide (BENICAR HCT) 40-25 MG  tablet, Take 1 tablet by mouth daily., Disp: 90 tablet, Rfl: 1 .  rosuvastatin (CRESTOR) 20 MG tablet, Take 1 tablet (20 mg total) by mouth daily., Disp: 90 tablet, Rfl: 1 .  Vitamin D, Cholecalciferol, 1000 units TABS, Take 1 tablet by mouth daily., Disp: , Rfl:  .  VITAMIN E PO, Take by mouth daily., Disp: , Rfl:   No Known Allergies  I personally reviewed active problem list, medication list, allergies, family history, social history, health maintenance with the patient/caregiver today.   ROS  Constitutional: Negative for fever , positive for  weight change.  Respiratory: Negative for cough and shortness of breath.   Cardiovascular: Negative for chest pain or palpitations.  Gastrointestinal: Negative for abdominal pain, no bowel changes.  Musculoskeletal: Negative for gait problem or joint swelling.  Skin: Negative for rash.  Neurological: Negative for dizziness or headache.  No other specific complaints in a complete review of  systems (except as listed in HPI above).  Objective  Vitals:   07/04/20 0857  BP: 124/76  Pulse: 86  Resp: 16  Temp: 98.2 F (36.8 C)  TempSrc: Oral  SpO2: 99%  Weight: 211 lb (95.7 kg)  Height: 5\' 3"  (1.6 m)    Body mass index is 37.38 kg/m.  Physical Exam  Constitutional: Patient appears well-developed and well-nourished. Obese  No distress.  HEENT: head atraumatic, normocephalic, pupils equal and reactive to light,  neck supple Cardiovascular: Normal rate, regular rhythm and normal heart sounds.  No murmur heard. No BLE edema. Pulmonary/Chest: Effort normal and breath sounds normal. No respiratory distress. Abdominal: Soft.  There is no tenderness. Psychiatric: Patient has a normal mood and affect. behavior is normal. Judgment and thought content normal.  Recent Results (from the past 2160 hour(s))  Basic metabolic panel     Status: Abnormal   Collection Time: 06/23/20  8:51 PM  Result Value Ref Range   Sodium 137 135 - 145 mmol/L    Potassium 3.9 3.5 - 5.1 mmol/L   Chloride 105 98 - 111 mmol/L   CO2 23 22 - 32 mmol/L   Glucose, Bld 102 (H) 70 - 99 mg/dL    Comment: Glucose reference range applies only to samples taken after fasting for at least 8 hours.   BUN 20 8 - 23 mg/dL   Creatinine, Ser 5.620.67 0.44 - 1.00 mg/dL   Calcium 9.4 8.9 - 13.010.3 mg/dL   GFR, Estimated >86>60 >57>60 mL/min    Comment: (NOTE) Calculated using the CKD-EPI Creatinine Equation (2021)    Anion gap 9 5 - 15    Comment: Performed at [facility], [address] [place], [place] [address]  CBC     Status: None   Collection Time: 06/23/20  8:51 PM  Result Value Ref Range   WBC 10.0 4.0 - 10.5 K/uL   RBC 4.56 3.87 - 5.11 MIL/uL   Hemoglobin 14.3 12.0 - 15.0 g/dL   HCT 29.541.3 28.436.0 - 13.246.0 %   MCV 90.6 80.0 - 100.0 fL   MCH 31.4 26.0 - 34.0 pg   MCHC 34.6 30.0 - 36.0 g/dL   RDW 44.013.3 10.211.5 - 72.515.5 %   Platelets 290 150 - 400 K/uL   nRBC 0.0 0.0 - 0.2 %    Comment: Performed at [facility], [address] [place], [place] [address]  Troponin I (High Sensitivity)     Status: None   Collection Time: 06/23/20  8:51 PM  Result Value Ref Range   Troponin I (High Sensitivity) 4 <18 ng/L    Comment: (NOTE) Elevated high sensitivity troponin I (hsTnI) values and significant  changes across serial measurements may suggest ACS but many other  chronic and acute conditions are known to elevate hsTnI results.  Refer to the "Links" section for chest pain algorithms and additional  guidance. Performed at [facility], [address] [place], [place] [address]       PHQ2/9: Depression screen [facility] 07/04/2020 01/01/2020 06/29/2019 06/29/2019 12/29/2018  Decreased Interest 2 0 1 0 0  Down, Depressed, Hopeless 2 0 2 0 0  PHQ - 2 Score 4 0 3 0 0  Altered sleeping 3 0 1 0 1  Tired, decreased energy 3 1 1  0 0  Change in appetite 0 1 0 0 2  Feeling bad or failure about yourself  1 0 0 0 0  Trouble concentrating 3 0 1 0 0  Moving  slowly or fidgety/restless 0 0 0 0 0  Suicidal thoughts 0 0 0 0 0  PHQ-9 Score 14 2 6  0 3  Difficult doing work/chores - Not difficult at all Somewhat difficult - Not difficult at all  Some recent data might be hidden    phq 9 is positive   Fall Risk: Fall Risk  07/04/2020 01/01/2020 06/29/2019 12/29/2018 06/28/2018  Falls in the past year? 0 0 0 0 0  Number falls in past yr: 0 0 0 0 0  Injury with Fall? 0 0 0 0 0  Comment - - - - -     Functional Status Survey: Is the patient deaf or have difficulty hearing?: No Does the patient have difficulty seeing, even when wearing glasses/contacts?: No Does the patient have difficulty concentrating, remembering, or making decisions?: Yes Does the patient have difficulty walking or climbing stairs?: No Does the patient have difficulty dressing or bathing?: No Does the patient have difficulty doing errands alone such as visiting a doctor's office or shopping?: No    Assessment & Plan  1. Atherosclerosis of aorta (HCC)  - Hepatic function panel  2. Benign hypertension   3. Metabolic syndrome  - Hemoglobin A1c  4. Centrilobular emphysema ([place])   5. Binge eating disorder   6. Mild recurrent major depression (HCC)  - escitalopram (LEXAPRO) 10 MG tablet; Take 1 tablet (10 mg total) by mouth daily.  Dispense: 90 tablet; Refill: 0  7. Morbid obesity ([place])  Discussed with the patient the risk posed by an increased BMI. Discussed importance of portion control, calorie counting and at least 150 minutes of physical activity weekly. Avoid sweet beverages and drink more water. Eat at least 6 servings of fruit and vegetables daily   8. Hyperglycemia  - Hemoglobin A1c  9. Dyslipidemia   10. Colon cancer screening  - Cologuard  11. Coronary artery calcification  - Lipid panel  12. Generalized anxiety disorder  - ALPRAZolam (XANAX) 0.5 MG tablet; Take 1 tablet (0.5 mg total) by mouth as needed for anxiety.  Dispense: 30  tablet; Refill: 0  13. Long-term use of high-risk medication  - Hepatic function panel  14. Encounter for screening mammogram for malignant neoplasm of breast  - MM 3D SCREEN BREAST BILATERAL; Future  15. Insomnia due to mental disorder  - hydrOXYzine (ATARAX/VISTARIL) 10 MG tablet; Take 1 tablet (10 mg total) by mouth at bedtime.  Dispense: 90 tablet; Refill: 0

## 2020-07-05 LAB — LIPID PANEL
Cholesterol: 146 mg/dL (ref <200)
HDL: 52 mg/dL (ref >=50)
LDL Cholesterol (Calc): 76 mg/dL (calc)
Non-HDL Cholesterol (Calc): 94 mg/dL (calc) (ref <130)
Total CHOL/HDL Ratio: 2.8 (calc) (ref <5.0)
Triglycerides: 92 mg/dL (ref <150)

## 2020-07-05 LAB — HEPATIC FUNCTION PANEL
AG Ratio: 1.9 (calc) (ref 1.0–2.5)
ALT: 18 U/L (ref 6–29)
AST: 15 U/L (ref 10–35)
Albumin: 4.4 g/dL (ref 3.6–5.1)
Alkaline phosphatase (APISO): 92 U/L (ref 37–153)
Bilirubin, Direct: 0.1 mg/dL (ref 0.0–0.2)
Globulin: 2.3 g/dL (calc) (ref 1.9–3.7)
Indirect Bilirubin: 0.3 mg/dL (calc) (ref 0.2–1.2)
Total Bilirubin: 0.4 mg/dL (ref 0.2–1.2)
Total Protein: 6.7 g/dL (ref 6.1–8.1)

## 2020-07-05 LAB — HEMOGLOBIN A1C
Hgb A1c MFr Bld: 5.5 % of total Hgb (ref <5.7)
Mean Plasma Glucose: 111 mg/dL
eAG (mmol/L): 6.2 mmol/L

## 2020-08-22 LAB — HM MAMMOGRAPHY

## 2020-08-28 LAB — COLOGUARD
COLOGUARD: POSITIVE — AB
Cologuard: POSITIVE — AB

## 2020-08-29 ENCOUNTER — Other Ambulatory Visit: Payer: Self-pay

## 2020-08-29 ENCOUNTER — Telehealth: Payer: Self-pay

## 2020-08-29 DIAGNOSIS — R195 Other fecal abnormalities: Secondary | ICD-10-CM

## 2020-08-29 DIAGNOSIS — Z1211 Encounter for screening for malignant neoplasm of colon: Secondary | ICD-10-CM

## 2020-08-29 NOTE — Telephone Encounter (Signed)
Copied from [place] [id number]. Topic: General - Other >> Aug 29, 2020 11:43 AM Valere Dross wrote: Reason for CRM: Tessa from [employer] called in about pt abnormal cologuard, and wanted to see if PCP has received he fax they sent over, please advise.

## 2020-09-01 ENCOUNTER — Telehealth: Payer: Self-pay

## 2020-09-01 NOTE — Telephone Encounter (Signed)
Spoke with pt and explained cologuard results and to schedule colonoscopy. Gave [facility] information.

## 2020-09-01 NOTE — Telephone Encounter (Signed)
Copied from [place] [phone]. Topic: General - Other >> Sep 01, 2020 11:47 AM Loma [place] wrote: Copied from [place] [phone]. Topic: General - Other >> Aug 29, 2020 11:43 AM Valere Dross wrote: Reason for CRM: Tessa from [employer] called in about pt abnormal cologuard, and wanted to see if PCP has received he fax they sent over, please advise.  Pt was called to sch a coloscopy and does not know about results! FU @ [phone]

## 2020-09-02 ENCOUNTER — Other Ambulatory Visit: Payer: Self-pay

## 2020-09-02 MED ORDER — CLENPIQ 10-3.5-12 MG-GM -GM/160ML PO SOLN: 320.0000 mL | ORAL | 0 refills | Status: DC

## 2020-09-15 ENCOUNTER — Ambulatory Visit
Admission: RE | Admit: 2020-09-15 | Discharge: 2020-09-15 | Disposition: A | Discharge status: Home / Self Care | Payer: Federal, State, Local not specified - PPO | Attending: Gastroenterology | Admitting: Gastroenterology

## 2020-09-15 ENCOUNTER — Ambulatory Visit: Payer: Federal, State, Local not specified - PPO | Admitting: Anesthesiology

## 2020-09-15 ENCOUNTER — Encounter
Admission: RE | Disposition: A | Discharge status: Home / Self Care | Payer: Self-pay | Source: Home / Self Care | Attending: Gastroenterology

## 2020-09-15 ENCOUNTER — Encounter: Payer: Self-pay | Admitting: Gastroenterology

## 2020-09-15 DIAGNOSIS — Z79899 Other long term (current) drug therapy: Secondary | ICD-10-CM | POA: Diagnosis not present

## 2020-09-15 DIAGNOSIS — K635 Polyp of colon: Secondary | ICD-10-CM | POA: Diagnosis not present

## 2020-09-15 DIAGNOSIS — D125 Benign neoplasm of sigmoid colon: Secondary | ICD-10-CM | POA: Diagnosis not present

## 2020-09-15 DIAGNOSIS — Z1211 Encounter for screening for malignant neoplasm of colon: Secondary | ICD-10-CM | POA: Diagnosis not present

## 2020-09-15 DIAGNOSIS — F17210 Nicotine dependence, cigarettes, uncomplicated: Secondary | ICD-10-CM | POA: Insufficient documentation

## 2020-09-15 DIAGNOSIS — Z8249 Family history of ischemic heart disease and other diseases of the circulatory system: Secondary | ICD-10-CM | POA: Diagnosis not present

## 2020-09-15 DIAGNOSIS — D122 Benign neoplasm of ascending colon: Secondary | ICD-10-CM | POA: Diagnosis not present

## 2020-09-15 DIAGNOSIS — Z833 Family history of diabetes mellitus: Secondary | ICD-10-CM | POA: Insufficient documentation

## 2020-09-15 DIAGNOSIS — Z823 Family history of stroke: Secondary | ICD-10-CM | POA: Insufficient documentation

## 2020-09-15 DIAGNOSIS — Z6836 Body mass index (BMI) 36.0-36.9, adult: Secondary | ICD-10-CM | POA: Diagnosis not present

## 2020-09-15 HISTORY — DX: Essential (primary) hypertension: I10

## 2020-09-15 HISTORY — DX: Personal history of urinary calculi: Z87.442

## 2020-09-15 HISTORY — PX: COLONOSCOPY: SHX5424

## 2020-09-15 SURGERY — COLONOSCOPY
Anesthesia: General

## 2020-09-15 MED ORDER — GLYCOPYRROLATE 0.2 MG/ML IJ SOLN
INTRAMUSCULAR | Status: AC
  Filled 2020-09-15: qty 1

## 2020-09-15 MED ORDER — SODIUM CHLORIDE 0.9 % IV SOLN
INTRAVENOUS | Status: DC
  Administered 2020-09-15: 1000 mL via INTRAVENOUS

## 2020-09-15 MED ORDER — FENTANYL CITRATE (PF) 100 MCG/2ML IJ SOLN
INTRAMUSCULAR | Status: AC
  Filled 2020-09-15: qty 2

## 2020-09-15 MED ORDER — FENTANYL CITRATE (PF) 100 MCG/2ML IJ SOLN
INTRAMUSCULAR | Status: DC | PRN
  Administered 2020-09-15 (×2): 25 ug via INTRAVENOUS
  Administered 2020-09-15: 50 ug via INTRAVENOUS

## 2020-09-15 MED ORDER — LIDOCAINE HCL (PF) 2 % IJ SOLN
INTRAMUSCULAR | Status: AC
  Filled 2020-09-15: qty 5

## 2020-09-15 MED ORDER — MIDAZOLAM HCL 2 MG/2ML IJ SOLN
INTRAMUSCULAR | Status: AC
  Filled 2020-09-15: qty 2

## 2020-09-15 MED ORDER — PROPOFOL 10 MG/ML IV BOLUS
INTRAVENOUS | Status: DC | PRN
Start: 2020-09-15 — End: 2020-09-15
  Administered 2020-09-15: 30 mg via INTRAVENOUS
  Administered 2020-09-15: 20 mg via INTRAVENOUS

## 2020-09-15 MED ORDER — MIDAZOLAM HCL 2 MG/2ML IJ SOLN
INTRAMUSCULAR | Status: DC | PRN
  Administered 2020-09-15: 2 mg via INTRAVENOUS

## 2020-09-15 MED ORDER — PROPOFOL 500 MG/50ML IV EMUL
INTRAVENOUS | Status: DC | PRN
  Administered 2020-09-15: 50 ug/kg/min via INTRAVENOUS

## 2020-09-15 MED ORDER — LIDOCAINE HCL (CARDIAC) PF 100 MG/5ML IV SOSY
PREFILLED_SYRINGE | INTRAVENOUS | Status: DC | PRN
  Administered 2020-09-15: 50 mg via INTRAVENOUS

## 2020-09-15 NOTE — H&P (Signed)
Veronica Bellows, MD [address], [place], [place], [place], [address] [address], [place], [place], [place], [address] Phone: [phone]  Fax: [phone]  Primary Care Physician:  Steele Sizer, MD   Pre-Procedure History & Physical: HPI:  Veronica Mendez is a 70 y.o. female is here for an colonoscopy.   Past Medical History:  Diagnosis Date   Depression    History of kidney stones    Hyperlipidemia    Hypertension    Obesity, Class III, BMI 40-49.9 (morbid obesity) ([place]) 11/10/2015    Past Surgical History:  Procedure Laterality Date   ABDOMINAL HYSTERECTOMY Bilateral 1988   KNEE SURGERY Left 2014    Prior to Admission medications   Medication Sig Start Date End Date Taking? Authorizing Provider  ALPRAZolam Duanne Moron) 0.5 MG tablet Take 1 tablet (0.5 mg total) by mouth as needed for anxiety. 07/04/20  Yes Sowles, Drue Stager, MD  ascorbic acid (VITAMIN C) 500 MG tablet Take 500 mg by mouth 2 (two) times daily.   Yes [provider]  calcium carbonate (TUMS - DOSED IN MG ELEMENTAL CALCIUM) 500 MG chewable tablet Chew 1 tablet by mouth as needed for indigestion or heartburn.   Yes [provider]  escitalopram (LEXAPRO) 10 MG tablet Take 1 tablet (10 mg total) by mouth daily. 07/04/20  Yes Sowles, Drue Stager, MD  hydrOXYzine (ATARAX/VISTARIL) 10 MG tablet Take 1 tablet (10 mg total) by mouth at bedtime. 07/04/20  Yes Sowles, Drue Stager, MD  magnesium gluconate (MAGONATE) 500 MG tablet Take 500 mg by mouth daily.   Yes [provider]  olmesartan-hydrochlorothiazide (BENICAR HCT) 40-25 MG tablet Take 1 tablet by mouth daily. 01/01/20  Yes Sowles, Drue Stager, MD  rosuvastatin (CRESTOR) 20 MG tablet Take 1 tablet (20 mg total) by mouth daily. 01/01/20  Yes Sowles, Drue Stager, MD  Sod Picosulfate-Mag Ox-Cit Acd (CLENPIQ) 10-3.5-12 MG-GM -GM/160ML SOLN Take 320 mLs by mouth as directed. 09/02/20  Yes Veronica Bellows, MD  Vitamin D, Cholecalciferol, 1000 units TABS Take  1 tablet by mouth daily.   Yes [provider]  VITAMIN E PO Take by mouth daily.   Yes [provider]    Allergies as of 09/01/2020   (No Known Allergies)    Family History  Problem Relation Age of Onset   Osteoarthritis Mother    Stroke Mother    Heart disease Father    Peripheral Artery Disease Sister    Diabetes Brother    Heart attack Brother    Diabetes Maternal Grandfather    Heart attack Paternal Uncle    Heart attack Paternal Uncle    Heart attack Paternal Uncle    Heart attack Paternal Uncle    Heart attack Paternal Uncle    Heart attack Paternal Uncle    Heart attack Paternal Uncle    Heart attack Brother    Atrial fibrillation Brother    Heart Problems Brother    Heart attack Brother    Stroke Brother    Heart disease Brother    Heart disease Other     Social History   Socioeconomic History   Marital status: Married    Spouse name: Geneticist, molecular   Number of children: 1   Years of education: Not on file   Highest education level: Some college, no degree  Occupational History   Occupation: Retired  Tobacco Use   Smoking status: Every Day    Packs/day: 1.00    Years: 50.00    Pack years: 50.00  Types: Cigarettes    Start date: 05/26/2016   Smokeless tobacco: Never   Tobacco comments:    down to 5 cigarettes daily   Vaping Use   Vaping Use: Never used  Substance and Sexual Activity   Alcohol use: Not Currently    Alcohol/week: 0.0 standard drinks   Drug use: No   Sexual activity: Yes    Partners: Male  Other Topics Concern   Not on file  Social History Narrative   Not on file   Social Determinants of Health   Financial Resource Strain: Not on file  Food Insecurity: Not on file  Transportation Needs: Not on file  Physical Activity: Not on file  Stress: Not on file  Social Connections: Not on file  Intimate Partner Violence: Not on file    Review of Systems: See HPI, otherwise negative ROS  Physical Exam: BP 107/77    Pulse 79   Temp (!) 96.6 F (35.9 C) (Temporal)   Resp 16   Ht '5\' 2"'$  (1.575 m)   Wt 90.9 kg   SpO2 98%   BMI 36.65 kg/m  General:   Alert,  pleasant and cooperative in NAD Head:  Normocephalic and atraumatic. Neck:  Supple; no masses or thyromegaly. Lungs:  Clear throughout to auscultation, normal respiratory effort.    Heart:  +S1, +S2, Regular rate and rhythm, No edema. Abdomen:  Soft, nontender and nondistended. Normal bowel sounds, without guarding, and without rebound.   Neurologic:  Alert and  oriented x4;  grossly normal neurologically.  Impression/Plan: Celesta Aver is here for an colonoscopy to be performed for Screening colonoscopy average risk   Risks, benefits, limitations, and alternatives regarding  colonoscopy have been reviewed with the patient.  Questions have been answered.  All parties agreeable.   Veronica Bellows, MD  09/15/2020, 9:57 AM

## 2020-09-15 NOTE — Anesthesia Postprocedure Evaluation (Signed)
Anesthesia Post Note  Patient: Veronica Mendez  Procedure(s) Performed: COLONOSCOPY  Patient location during evaluation: PACU Anesthesia Type: General Level of consciousness: awake and alert Pain management: pain level controlled Vital Signs Assessment: post-procedure vital signs reviewed and stable Respiratory status: spontaneous breathing, nonlabored ventilation and respiratory function stable Cardiovascular status: blood pressure returned to baseline and stable Postop Assessment: no apparent nausea or vomiting Anesthetic complications: no   No notable events documented.   Last Vitals:  Vitals:   09/15/20 1037 09/15/20 1047  BP: (!) 118/59 111/85  Pulse: 84 80  Resp: 16 12  Temp: (!) 35.9 C   SpO2: 98% 98%    Last Pain:  Vitals:   09/15/20 1047  TempSrc:   PainSc: 0-No pain                 [place] Ouch

## 2020-09-15 NOTE — Anesthesia Preprocedure Evaluation (Addendum)
Anesthesia Evaluation  Patient identified by MRN, date of birth, ID band Patient awake    Reviewed: Allergy & Precautions, NPO status , Patient's Chart, lab work & pertinent test results, reviewed documented beta blocker date and time   Airway Mallampati: I  TM Distance: >3 FB Neck ROM: Full    Dental no notable dental hx.    Pulmonary COPD (Centrolobular emphysema), Current SmokerPatient did not abstain from smoking.,    Pulmonary exam normal        Cardiovascular Exercise Tolerance: Good hypertension, + CAD   Rhythm:Regular Rate:Normal     Neuro/Psych PSYCHIATRIC DISORDERS Anxiety Depression  Neuromuscular disease (Fibromyalgia, Carpal Tunnel Syndrome)    GI/Hepatic negative GI ROS, Neg liver ROS,   Endo/Other  Obese with metabolic syndrome  Renal/GU negative Renal ROSNephrolithiasis  negative genitourinary   Musculoskeletal  (+) Arthritis , Osteoarthritis,  Fibromyalgia -  Abdominal (+) + obese,   Peds  Hematology   Anesthesia Other Findings   Reproductive/Obstetrics                            Anesthesia Physical Anesthesia Plan  ASA: 3  Anesthesia Plan: General   Post-op Pain Management:    Induction: Intravenous  PONV Risk Score and Plan: TIVA and Treatment may vary due to age or medical condition  Airway Management Planned: Natural Airway and Nasal Cannula  Additional Equipment:   Intra-op Plan:   Post-operative Plan:   Informed Consent: I have reviewed the patients History and Physical, chart, labs and discussed the procedure including the risks, benefits and alternatives for the proposed anesthesia with the patient or authorized representative who has indicated his/her understanding and acceptance.     Dental advisory given  Plan Discussed with: Anesthesiologist and CRNA  Anesthesia Plan Comments:        Anesthesia Quick Evaluation

## 2020-09-15 NOTE — Transfer of Care (Signed)
Immediate Anesthesia Transfer of Care Note  Patient: Veronica Mendez  Procedure(s) Performed: COLONOSCOPY  Patient Location: PACU  Anesthesia Type:General  Level of Consciousness: sedated  Airway & Oxygen Therapy: Patient Spontanous Breathing and Patient connected to nasal cannula oxygen  Post-op Assessment: Report given to RN and Post -op Vital signs reviewed and stable  Post vital signs: Reviewed and stable  Last Vitals:  Vitals Value Taken Time  BP 118/59 09/15/20 1037  Temp 35.9 C 09/15/20 1037  Pulse 81 09/15/20 1038  Resp 15 09/15/20 1038  SpO2 98 % 09/15/20 1038  Vitals shown include unvalidated device data.  Last Pain:  Vitals:   09/15/20 1037  TempSrc: Temporal  PainSc: 0-No pain         Complications: No notable events documented.

## 2020-09-15 NOTE — Op Note (Signed)
[facility] Gastroenterology Patient Name: Veronica Mendez Procedure Date: 09/15/2020 9:58 AM MRN: [id number] Account #: [id number] Date of Birth: [birth year] Admit Type: Outpatient Age: 70 Room: [facility] ENDO ROOM 2 Gender: Female Note Status: Finalized Procedure:             Colonoscopy Indications:           Screening for colorectal malignant neoplasm Providers:             Jonathon Bellows MD, MD Referring MD:          Bethena Roys. Sowles, MD (Referring MD) Medicines:             Monitored Anesthesia Care Complications:         No immediate complications. Procedure:             Pre-Anesthesia Assessment:                        - Prior to the procedure, a History and Physical was                         performed, and patient medications, allergies and                         sensitivities were reviewed. The patient's tolerance                         of previous anesthesia was reviewed.                        - The risks and benefits of the procedure and the                         sedation options and risks were discussed with the                         patient. All questions were answered and informed                         consent was obtained.                        - ASA Grade Assessment: II - A patient with mild                         systemic disease.                        After obtaining informed consent, the colonoscope was                         passed under direct vision. Throughout the procedure,                         the patient's blood pressure, pulse, and oxygen                         saturations were monitored continuously. The                         Colonoscope was introduced through the anus  and                         advanced to the the cecum, identified by the                         appendiceal orifice. The colonoscopy was performed                         with ease. The patient tolerated the procedure well.                         The quality  of the bowel preparation was excellent. Findings:      The perianal and digital rectal examinations were normal.      A 3 mm polyp was found in the ascending colon. The polyp was sessile.       The polyp was removed with a jumbo cold forceps. Resection and retrieval       were complete.      A 5 mm polyp was found in the sigmoid colon. The polyp was sessile. The       polyp was removed with a hot snare. Resection and retrieval were       complete.      A 10 mm polyp was found in the sigmoid colon. The polyp was       pedunculated. The polyp was removed with a hot snare. Resection and       retrieval were complete.      The exam was otherwise without abnormality on direct and retroflexion       views. Impression:            - One 3 mm polyp in the ascending colon, removed with                         a jumbo cold forceps. Resected and retrieved.                        - One 5 mm polyp in the sigmoid colon, removed with a                         hot snare. Resected and retrieved.                        - One 10 mm polyp in the sigmoid colon, removed with a                         hot snare. Resected and retrieved.                        - The examination was otherwise normal on direct and                         retroflexion views. Recommendation:        - Discharge patient to home (with escort).                        - Resume previous diet.                        -  Continue present medications.                        - Await pathology results.                        - Repeat colonoscopy for surveillance based on                         pathology results. Procedure Code(s):     --- Professional ---                        [phone], Colonoscopy, flexible; with removal of                         tumor(s), polyp(s), or other lesion(s) by snare                         technique                        45380, 74, Colonoscopy, flexible; with biopsy, single                         or  multiple Diagnosis Code(s):     --- Professional ---                        Z12.11, Encounter for screening for malignant neoplasm                         of colon                        K63.5, Polyp of colon CPT copyright 2019 American [employer] Association. All rights reserved. The codes documented in this report are preliminary and upon coder review may  be revised to meet current compliance requirements. Jonathon Bellows, MD Jonathon Bellows MD, MD 09/15/2020 10:38:59 AM This report has been signed electronically. Number of Addenda: 0 Note Initiated On: 09/15/2020 9:58 AM Scope Withdrawal Time: 0 hours 16 minutes 23 seconds  Total Procedure Duration: 0 hours 20 minutes 58 seconds  Estimated Blood Loss:  Estimated blood loss: none.      [facility]

## 2020-09-16 ENCOUNTER — Encounter: Payer: Self-pay | Admitting: Gastroenterology

## 2020-09-16 LAB — SURGICAL PATHOLOGY

## 2020-09-18 ENCOUNTER — Encounter: Payer: Self-pay | Admitting: Gastroenterology

## 2020-09-29 ENCOUNTER — Other Ambulatory Visit: Payer: Self-pay | Admitting: Family Medicine

## 2020-09-29 DIAGNOSIS — F330 Major depressive disorder, recurrent, mild: Secondary | ICD-10-CM

## 2020-10-02 NOTE — Progress Notes (Signed)
Name: Veronica Mendez   MRN: [id number]    DOB: 06-15-1950   Date:10/06/2020       Progress Note  Subjective  Chief Complaint  Follow Up  HPI  HTN: bp is at goal.  Advised her to continue Benicar HCTZ for bp, however since she has lost 20 lbs since last year we will decrease dose of HCTZ to 12.5 mg and monitor.  No chest pain,  palpitation or decrease in exercise tolerance , she has occasional dizziness . Recently seen by Dr. Rockey Situ bp is at goal .    Hyperlipidemia:  She was found to have calcification of coronary vessels found on CT chest, she is on high dose statin now and last LDL was down to 76, she is losing weight.    Major Depression/GAD:she was on  Lexapro for many years, unable to take Cymbalta we switched to Citalopram and did not help, she is was  on Effexor since 02/2017 she had a relapse and May 2019 we increased dose, she started to feel better, started to do some meditation and establish boundaries and started to feel better. She has been under a lot of stress , her husbands health has been going down, diagnosed with Afib also a recent stroke, parkinson's and also lung cancer . She states since she went back on Lexapro she is feeling better, Phq 9 and GAD also improved. Continue current medications She stopped hydroxyzine since it did not work for her    FMS: she is eating healthier and states pain is much better. She does not want to take Lyrica or Gabapentin, going to chiropractor for neck pain, she still on a low carb diet and symptoms have been controlled Unchanged    Obesity:  She started Vyvanse back in  April 2018 at a weight of 252 lbs.  She likes to eat food - not necessarily desserts. She never tried laxatives or throwing up to help with weight loss. She had lost 32  lbs while on Vyvanse but stopped taking it and gained some weight. She has lost 21 lbs since last Nov. She is on a low carbohydrate diet    Metabolic Syndrome: she has polyphagia, no polydipsia or polyuria.  She had elevated fasting insulin, she has lost weight. Last A1C was normal    Centrolobular emphysema: she has mild dry cough in am's, but no SOB or wheezing. She states worse when laying down, during exam she had rhonchi and wheezing and is now willing to try Trelegy  She is still smoking, she is up to 10 cigarettes daily but trying to go down to 5  daily again.    Atherosclerosis , aorta : discussed results from CT chest done, she is on high dose Crestor and aspirin, seen by cardiologist and given reassurance. She also has significant family history of heart disease,but denies chest pain.  Her LDL down to 76, discussed going up on Rosuvastatin to 40 mg and she agreed on trying higher dose    Lung nodule: she is still smoking down to 5 cigarettes daily but unable to quit, last CT lung was 04/2020, she is on yearly follow ups. Unchanged   Patient Active Problem List   Diagnosis Date Noted   Morbid obesity, unspecified obesity type ([place]) 06/28/2018   Depression, major, recurrent, mild ([place]) 06/28/2018   Coronary artery calcification 09/06/2016   Lung nodule, solitary 07/30/2016   Atherosclerosis of aorta ([place]) 07/30/2016   Centrilobular emphysema ([place]) 07/30/2016   Personal history  of tobacco use, presenting hazards to health 07/30/2016   Binge eating disorder 11/10/2015   Hyperglycemia 07/09/2015   Radiculitis of left cervical region 10/28/2014   Vitamin D deficiency 10/26/2014   Lumbosacral spondylosis without myelopathy 10/26/2014   Extreme obesity 10/26/2014   De Quervain's disease (radial styloid tenosynovitis) 10/26/2014   Nephrolithiasis 10/23/2014   Trigger finger, right 10/23/2014   Carpal tunnel syndrome 10/23/2014   Generalized anxiety disorder 10/23/2014   Major depression ([place]) 10/23/2014   Fibromyalgia 10/23/2014   Perennial allergic rhinitis 10/23/2014   Dyslipidemia 10/23/2014   Benign hypertension 10/23/2014   Morbid drug-induced obesity 10/23/2014   Insomnia  [id number]   Metabolic syndrome [id number]   Tobacco use disorder 10/23/2014   Menopause syndrome 10/23/2014   Lumbago 10/23/2014   Osteoarthritis 10/23/2014   Atrophic vaginitis 10/23/2014    Past Surgical History:  Procedure Laterality Date   ABDOMINAL HYSTERECTOMY Bilateral 1988   COLONOSCOPY N/A 09/15/2020   Procedure: COLONOSCOPY;  Surgeon: Jonathon Bellows, MD;  Location: [facility] ENDOSCOPY;  Service: Gastroenterology;  Laterality: N/A;   KNEE SURGERY Left 2014    Family History  Problem Relation Age of Onset   Osteoarthritis Mother    Stroke Mother    Heart disease Father    Peripheral Artery Disease Sister    Diabetes Brother    Heart attack Brother    Diabetes Maternal Grandfather    Heart attack Paternal Uncle    Heart attack Paternal Uncle    Heart attack Paternal Uncle    Heart attack Paternal Uncle    Heart attack Paternal Uncle    Heart attack Paternal Uncle    Heart attack Paternal Uncle    Heart attack Brother    Atrial fibrillation Brother    Heart Problems Brother    Heart attack Brother    Stroke Brother    Heart disease Brother    Heart disease Other     Social History   Tobacco Use   Smoking status: Every Day    Packs/day: 1.00    Years: 50.00    Pack years: 50.00    Types: Cigarettes    Start date: 05/26/2016   Smokeless tobacco: Never   Tobacco comments:    down to 5 cigarettes daily   Substance Use Topics   Alcohol use: Not Currently    Alcohol/week: 0.0 standard drinks     Current Outpatient Medications:    ALPRAZolam (XANAX) 0.5 MG tablet, Take 1 tablet (0.5 mg total) by mouth as needed for anxiety., Disp: 30 tablet, Rfl: 0   ascorbic acid (VITAMIN C) 500 MG tablet, Take 500 mg by mouth 2 (two) times daily., Disp: , Rfl:    calcium carbonate (TUMS - DOSED IN MG ELEMENTAL CALCIUM) 500 MG chewable tablet, Chew 1 tablet by mouth as needed for indigestion or heartburn., Disp: , Rfl:    escitalopram (LEXAPRO) 10 MG tablet, TAKE 1 TABLET BY  MOUTH EVERY DAY, Disp: 90 tablet, Rfl: 0   magnesium gluconate (MAGONATE) 500 MG tablet, Take 500 mg by mouth daily., Disp: , Rfl:    olmesartan-hydrochlorothiazide (BENICAR HCT) 40-25 MG tablet, Take 1 tablet by mouth daily., Disp: 90 tablet, Rfl: 1   rosuvastatin (CRESTOR) 20 MG tablet, Take 1 tablet (20 mg total) by mouth daily., Disp: 90 tablet, Rfl: 1   Sod Picosulfate-Mag Ox-Cit Acd (CLENPIQ) 10-3.5-12 MG-GM -GM/160ML SOLN, Take 320 mLs by mouth as directed., Disp: 320 mL, Rfl: 0   Vitamin D, Cholecalciferol, 1000 units TABS, Take 1 tablet  by mouth daily., Disp: , Rfl:    VITAMIN E PO, Take by mouth daily., Disp: , Rfl:    hydrOXYzine (ATARAX/VISTARIL) 10 MG tablet, Take 1 tablet (10 mg total) by mouth at bedtime. (Patient not taking: Reported on 10/06/2020), Disp: 90 tablet, Rfl: 0  No Known Allergies  I personally reviewed active problem list, medication list, allergies, family history, social history, health maintenance with the patient/caregiver today.   ROS  Constitutional: Negative for fever, positive for weight change.  Respiratory: Negative for cough and shortness of breath.   Cardiovascular: Negative for chest pain or palpitations.  Gastrointestinal: Negative for abdominal pain, no bowel changes.  Musculoskeletal: Negative for gait problem or joint swelling.  Skin: Negative for rash.  Neurological: Negative for dizziness or headache.  No other specific complaints in a complete review of systems (except as listed in HPI above).   Objective  Vitals:   10/06/20 1109  BP: 120/68  Pulse: 89  Resp: 16  Temp: 98.9 F (37.2 C)  TempSrc: Oral  SpO2: 94%  Weight: 201 lb 14.4 oz (91.6 kg)  Height: '5\' 3"'$  (1.6 m)    Body mass index is 35.76 kg/m.  Physical Exam  Constitutional: Patient appears well-developed and well-nourished. Obese  No distress.  HEENT: head atraumatic, normocephalic, pupils equal and reactive to light, neck supple Cardiovascular: Normal rate,  regular rhythm and normal heart sounds.  No murmur heard. No BLE edema. Pulmonary/Chest: Effort normal , but has diffuse rhonchi and wheezing Skin: erythematous and bumpy forehead no pustules  Abdominal: Soft.  There is no tenderness. Psychiatric: Patient has a normal mood and affect. behavior is normal. Judgment and thought content normal.   Recent Results (from the past 2160 hour(s))  Cologuard     Status: Abnormal   Collection Time: 08/28/20 12:00 AM  Result Value Ref Range   Cologuard Positive (A) Negative  Surgical pathology     Status: None   Collection Time: 09/15/20 10:19 AM  Result Value Ref Range   SURGICAL PATHOLOGY      SURGICAL PATHOLOGY CASE: [id number] PATIENT: [facility] Report     Specimen Submitted: A. Colon polyp, ascending; cbx B. Colon polyp x2, sigmoid; hot and cold snare  Clinical History: Positive colorectal cancer screening using cologuard test.  Polyp      DIAGNOSIS: A.  COLON POLYP, ASCENDING; COLD BIOPSY: - TUBULAR ADENOMA. - NEGATIVE FOR HIGH-GRADE DYSPLASIA AND MALIGNANCY.  B.  COLON POLYP X2, SIGMOID; HOT AND COLD SNARE: - TUBULAR ADENOMA (1); NEGATIVE FOR HIGH-GRADE DYSPLASIA; INKED BASE IS FREE OF DYSPLASIA. - HYPERPLASTIC POLYP (1); NEGATIVE FOR DYSPLASIA AND MALIGNANCY.  GROSS DESCRIPTION: A. Labeled: cbx ascending colon polyp Received: Formalin Collection time: 10:19 AM on 09/15/2020 Placed into formalin time: 10:19 AM on 09/15/2020 Tissue fragment(s): 2 Size: Range from 0.4-0.5 cm Description: Tan soft tissue fragments Entirely submitted in 1 cassette.  B. Labeled: Hot and cold snare sigmoid colon polyp x2 Received:  Formalin Collection time: 10:28 AM on 09/15/2020 Placed into formalin time: 10:28 AM on 09/15/2020 Tissue fragment(s): 2 Size: Range from 0.7-1.1 cm Description: Received are 2 fragments of tan-pink soft tissue.  There are presumed resection margins which are differentially inked.   The fragments are bisected. Entirely submitted in cassettes 1-2 with 1 bisected fragment per cassette.  RB 09/15/2020  Final Diagnosis performed by Quay Burow, MD.   Electronically signed 09/16/2020 12:42:17PM The electronic signature indicates that the named Attending Pathologist has evaluated the specimen Technical component performed at  [facility] [address] [place], [place] [address] Lab: [phone] Dir: Rush Farmer, MD, MMM  Professional component performed at [facility], [facility], [place], [place],  [address] Lab: [phone] Dir: Dellia Nims. Rubinas, MD     PHQ2/9: Depression screen [facility] 2/9 10/06/2020 07/04/2020 01/01/2020 06/29/2019 06/29/2019  Decreased Interest 1 2 0 1 0  Down, Depressed, Hopeless 0 2 0 2 0  PHQ - 2 Score 1 4 0 3 0  Altered sleeping 2 3 0 1 0  Tired, decreased energy 0 '3 1 1 '$ 0  Change in appetite 0 0 1 0 0  Feeling bad or failure about yourself  0 1 0 0 0  Trouble concentrating 1 3 0 1 0  Moving slowly or fidgety/restless 0 0 0 0 0  Suicidal thoughts 0 0 0 0 0  PHQ-9 Score '4 14 2 6 '$ 0  Difficult doing work/chores Not difficult at all - Not difficult at all Somewhat difficult -  Some recent data might be hidden    phq 9 is positive  GAD 7 : Generalized Anxiety Score 10/06/2020 07/04/2020 01/01/2020 12/29/2018  Nervous, Anxious, on Edge 1 3 0 0  Control/stop worrying 0 3 0 0  Worry too much - different things 0 1 0 0  Trouble relaxing 1 3 0 0  Restless 0 3 0 0  Easily annoyed or irritable 0 1 0 0  Afraid - awful might happen 1 3 0 0  Total GAD 7 Score 3 17 0 0  Anxiety Difficulty Not difficult at all - - Not difficult at all     Fall Risk: Fall Risk  10/06/2020 07/04/2020 01/01/2020 06/29/2019 12/29/2018  Falls in the past year? 0 0 0 0 0  Number falls in past yr: 0 0 0 0 0  Injury with Fall? 0 0 0 0 0  Comment - - - - -     Functional Status Survey: Is the patient deaf or have difficulty hearing?: No Does  the patient have difficulty seeing, even when wearing glasses/contacts?: No Does the patient have difficulty concentrating, remembering, or making decisions?: Yes (occasionally) Does the patient have difficulty walking or climbing stairs?: No Does the patient have difficulty dressing or bathing?: No Does the patient have difficulty doing errands alone such as visiting a doctor's office or shopping?: No    Assessment & Plan  1. Benign hypertension  - olmesartan-hydrochlorothiazide (BENICAR HCT) 40-12.5 MG tablet; Take 1 tablet by mouth daily.  Dispense: 90 tablet; Refill: 1  2. Metabolic syndrome   3. Morbid obesity ([place])  Discussed with the patient the risk posed by an increased BMI. Discussed importance of portion control, calorie counting and at least 150 minutes of physical activity weekly. Avoid sweet beverages and drink more water. Eat at least 6 servings of fruit and vegetables daily    4. Centrilobular emphysema ([place])   5. Atherosclerosis of aorta ([facility])  - rosuvastatin (CRESTOR) 40 MG tablet; Take 1 tablet (40 mg total) by mouth daily. In place of 20 mg  Dispense: 90 tablet; Refill: 1  6. Dyslipidemia  - rosuvastatin (CRESTOR) 40 MG tablet; Take 1 tablet (40 mg total) by mouth daily. In place of 20 mg  Dispense: 90 tablet; Refill: 1  7. Generalized anxiety disorder   8. Coronary artery calcification  - rosuvastatin (CRESTOR) 40 MG tablet; Take 1 tablet (40 mg total) by mouth daily. In place of 20 mg  Dispense: 90 tablet; Refill: 1  9. Mild recurrent  major depression ([place])   10. Need for Tdap vaccination  - Tdap vaccine greater than or equal to 7yo IM  11. Need for shingles vaccine  - Varicella-zoster vaccine IM (Shingrix)    12. Rosacea  - clindamycin (CLINDAGEL) 1 % gel; Apply topically 2 (two) times daily.  Dispense: 30 g; Refill: 0

## 2020-10-06 ENCOUNTER — Encounter: Payer: Self-pay | Admitting: Family Medicine

## 2020-10-06 ENCOUNTER — Ambulatory Visit: Payer: Federal, State, Local not specified - PPO | Admitting: Family Medicine

## 2020-10-06 ENCOUNTER — Other Ambulatory Visit: Payer: Self-pay

## 2020-10-06 VITALS — BP 120/68 | HR 89 | Temp 98.9°F | Resp 16 | Ht 63.0 in | Wt 201.9 lb

## 2020-10-06 DIAGNOSIS — F411 Generalized anxiety disorder: Secondary | ICD-10-CM

## 2020-10-06 DIAGNOSIS — I2510 Atherosclerotic heart disease of native coronary artery without angina pectoris: Secondary | ICD-10-CM

## 2020-10-06 DIAGNOSIS — I700 Atherosclerosis of aorta: Secondary | ICD-10-CM

## 2020-10-06 DIAGNOSIS — E8881 Metabolic syndrome: Secondary | ICD-10-CM | POA: Diagnosis not present

## 2020-10-06 DIAGNOSIS — Z23 Encounter for immunization: Secondary | ICD-10-CM

## 2020-10-06 DIAGNOSIS — I10 Essential (primary) hypertension: Secondary | ICD-10-CM | POA: Diagnosis not present

## 2020-10-06 DIAGNOSIS — I2584 Coronary atherosclerosis due to calcified coronary lesion: Secondary | ICD-10-CM

## 2020-10-06 DIAGNOSIS — L719 Rosacea, unspecified: Secondary | ICD-10-CM

## 2020-10-06 DIAGNOSIS — F330 Major depressive disorder, recurrent, mild: Secondary | ICD-10-CM

## 2020-10-06 DIAGNOSIS — E785 Hyperlipidemia, unspecified: Secondary | ICD-10-CM

## 2020-10-06 DIAGNOSIS — J432 Centrilobular emphysema: Secondary | ICD-10-CM | POA: Diagnosis not present

## 2020-10-06 MED ORDER — ROSUVASTATIN CALCIUM 20 MG PO TABS: 20.0000 mg | ORAL_TABLET | Freq: Every day | ORAL | 1 refills | Status: DC

## 2020-10-06 MED ORDER — TRELEGY ELLIPTA 100-62.5-25 MCG/INH IN AEPB: 1.0000 | INHALATION_SPRAY | Freq: Every day | RESPIRATORY_TRACT | 11 refills | Status: DC

## 2020-10-06 MED ORDER — ROSUVASTATIN CALCIUM 40 MG PO TABS: 40.0000 mg | ORAL_TABLET | Freq: Every day | ORAL | 1 refills | Status: DC

## 2020-10-06 MED ORDER — OLMESARTAN MEDOXOMIL-HCTZ 40-12.5 MG PO TABS: 1.0000 | ORAL_TABLET | Freq: Every day | ORAL | 1 refills | Status: DC

## 2020-10-06 MED ORDER — CLINDAMYCIN PHOSPHATE 1 % EX GEL: Freq: Two times a day (BID) | CUTANEOUS | 0 refills | Status: DC

## 2020-11-19 ENCOUNTER — Other Ambulatory Visit: Payer: Self-pay | Admitting: Family Medicine

## 2020-11-19 DIAGNOSIS — I10 Essential (primary) hypertension: Secondary | ICD-10-CM

## 2021-01-05 NOTE — Progress Notes (Signed)
Name: Veronica Mendez   MRN: [id number]    DOB: [birth year]   Date:01/06/2021       Progress Note  Subjective  Chief Complaint  Follow Up  HPI   HTN: Hypertension is well managed on current medications. Denies palpitations, chest pain,or lightheadedness.   Hyperlipidemia/Atherosclerosis of Aorta: Patient is taking statin therapy and denies side effects or concerns. Last LDL 76  Psych: Patient reports anxiety has increased and Lexapro was not helpful. She states Lexapro creates "brain fog" and she did not feel it was beneficial to her depression and anxiety.  She is using Alprazolam approx 1-2 times per week but expresses desire to take more frequently amd states she may need refill with more frequent dosing.She has not taken Lexapro in 3 months. She states she has taken Citalopram, Lexapro, and Prozac, duloxetine in the past for management but these have not provided relief. She reports increased fears and anxiety surrounding her husband's health and is amenable to preventive medication. She also expresses concern for her granddaughter as she has had a brain bleed, heart condition and is at risk for stroke. Discussed adding Buspar 1-2 times per day for anxiety and she is amenable   Colonoscopy: Patient had colonoscopy 09/15/2020 with findings that indicate need for colonoscopy every 3 years.   Smoking/ Emphysema/ lung nodule: Patient states she is still smoking and is currently at approx 10 cigarettes per day. She still has mild, intermittent cough several times per week. Denies SOB. States symptoms are well managed with inhaler.   Obesity: Patient has gained 4 lbs since Aug. States she is returning to carb reduction and dietary management to continue weight loss efforts.   Immunizations: high dose influenza administered  today. Rx for second Shingrix to be sent to pharmacy.  Got most recent covid booster at the end of Oct.     Patient Active Problem List   Diagnosis Date Noted   Morbid  obesity, unspecified obesity type ([place]) 06/28/2018   Depression, major, recurrent, mild ([place]) 06/28/2018   Coronary artery calcification 09/06/2016   Lung nodule, solitary 07/30/2016   Atherosclerosis of aorta ([place]) 07/30/2016   Centrilobular emphysema ([place]) 07/30/2016   Personal history of tobacco use, presenting hazards to health 07/30/2016   Binge eating disorder 11/10/2015   Hyperglycemia 07/09/2015   Radiculitis of left cervical region 10/28/2014   Vitamin D deficiency 10/26/2014   Lumbosacral spondylosis without myelopathy 10/26/2014   Extreme obesity 10/26/2014   De Quervain's disease (radial styloid tenosynovitis) 10/26/2014   Nephrolithiasis 10/23/2014   Trigger finger, right 10/23/2014   Carpal tunnel syndrome 10/23/2014   Generalized anxiety disorder 10/23/2014   Major depression ([place]) 10/23/2014   Fibromyalgia 10/23/2014   Perennial allergic rhinitis 10/23/2014   Dyslipidemia 10/23/2014   Benign hypertension 10/23/2014   Morbid drug-induced obesity 10/23/2014   Insomnia 10/62/6948   Metabolic syndrome 54/62/7035   Tobacco use disorder 10/23/2014   Menopause syndrome 10/23/2014   Lumbago 10/23/2014   Osteoarthritis 10/23/2014   Atrophic vaginitis 10/23/2014    Past Surgical History:  Procedure Laterality Date   ABDOMINAL HYSTERECTOMY Bilateral 1988   COLONOSCOPY N/A 09/15/2020   Procedure: COLONOSCOPY;  Surgeon: Jonathon Bellows, MD;  Location: [facility] ENDOSCOPY;  Service: Gastroenterology;  Laterality: N/A;   KNEE SURGERY Left 2014    Family History  Problem Relation Age of Onset   Osteoarthritis Mother    Stroke Mother    Heart disease Father    Peripheral Artery Disease Sister    Diabetes Brother  Heart attack Brother    Diabetes Maternal Grandfather    Heart attack Paternal Uncle    Heart attack Paternal Uncle    Heart attack Paternal Uncle    Heart attack Paternal Uncle    Heart attack Paternal Uncle    Heart attack Paternal Uncle    Heart attack  Paternal Uncle    Heart attack Brother    Atrial fibrillation Brother    Heart Problems Brother    Heart attack Brother    Stroke Brother    Heart disease Brother    Heart disease Other     Social History   Tobacco Use   Smoking status: Every Day    Packs/day: 1.00    Years: 50.00    Pack years: 50.00    Types: Cigarettes    Start date: 05/26/2016   Smokeless tobacco: Never   Tobacco comments:    down to 5 cigarettes daily   Substance Use Topics   Alcohol use: Not Currently    Alcohol/week: 0.0 standard drinks     Current Outpatient Medications:    busPIRone (BUSPAR) 10 MG tablet, Take 1 tablet (10 mg total) by mouth 2 (two) times daily., Disp: 60 tablet, Rfl: 0   calcium carbonate (TUMS - DOSED IN MG ELEMENTAL CALCIUM) 500 MG chewable tablet, Chew 1 tablet by mouth as needed for indigestion or heartburn., Disp: , Rfl:    clindamycin (CLINDAGEL) 1 % gel, Apply topically 2 (two) times daily., Disp: 30 g, Rfl: 0   Fluticasone-Umeclidin-Vilant (TRELEGY ELLIPTA) 100-62.5-25 MCG/INH AEPB, Inhale 1 puff into the lungs daily., Disp: 1 each, Rfl: 11   olmesartan-hydrochlorothiazide (BENICAR HCT) 40-12.5 MG tablet, Take 1 tablet by mouth daily., Disp: 90 tablet, Rfl: 1   rosuvastatin (CRESTOR) 40 MG tablet, Take 1 tablet (40 mg total) by mouth daily. In place of 20 mg, Disp: 90 tablet, Rfl: 1   ALPRAZolam (XANAX) 0.5 MG tablet, Take 1 tablet (0.5 mg total) by mouth as needed for anxiety., Disp: 25 tablet, Rfl: 0   Vitamin D, Cholecalciferol, 1000 units TABS, Take 1 tablet by mouth daily. (Patient not taking: Reported on 01/06/2021), Disp: , Rfl:   No Known Allergies  I personally reviewed active problem list, medication list, allergies, family history, social history, health maintenance with the patient/caregiver today.   Review of Systems  Psychiatric/Behavioral:  The patient is nervous/anxious.     Objective   Vitals:   01/06/21 1304  BP: 122/68  Pulse: 89  Resp: 16   Temp: 97.7 F (36.5 C)  SpO2: 98%  Weight: 206 lb (93.4 kg)  Height: 5\' 3"  (1.6 m)    Body mass index is 36.49 kg/m.  Physical Exam Cardiovascular:     Rate and Rhythm: Normal rate and regular rhythm.     Heart sounds: Normal heart sounds.  Pulmonary:     Effort: Pulmonary effort is normal.  Musculoskeletal:     Right lower leg: No edema.     Left lower leg: No edema.  Psychiatric:        Attention and Perception: Attention normal.        Mood and Affect: Mood and affect normal.        Speech: Speech normal.        Behavior: Behavior normal. Behavior is cooperative.        Cognition and Memory: Cognition normal.     PHQ2/9: Depression screen [facility] 2/9 01/06/2021 10/06/2020 07/04/2020 01/01/2020 06/29/2019  Decreased Interest 1 1 2  0 1  Down, Depressed, Hopeless 1 0 2 0 2  PHQ - 2 Score 2 1 4  0 3  Altered sleeping 2 2 3  0 1  Tired, decreased energy 0 0 3 1 1   Change in appetite 0 0 0 1 0  Feeling bad or failure about yourself  0 0 1 0 0  Trouble concentrating 2 1 3  0 1  Moving slowly or fidgety/restless 0 0 0 0 0  Suicidal thoughts 0 0 0 0 0  PHQ-9 Score 6 4 14 2 6   Difficult doing work/chores - Not difficult at all - Not difficult at all Somewhat difficult  Some recent data might be hidden    phq 9 is positive  GAD 7 : Generalized Anxiety Score 01/06/2021 10/06/2020 07/04/2020 01/01/2020  Nervous, Anxious, on Edge 3 1 3  0  Control/stop worrying 3 0 3 0  Worry too much - different things 3 0 1 0  Trouble relaxing 3 1 3  0  Restless 3 0 3 0  Easily annoyed or irritable 0 0 1 0  Afraid - awful might happen 3 1 3  0  Total GAD 7 Score 18 3 17  0  Anxiety Difficulty - Not difficult at all - -      Fall Risk: Fall Risk  01/06/2021 10/06/2020 07/04/2020 01/01/2020 06/29/2019  Falls in the past year? 0 0 0 0 0  Number falls in past yr: 0 0 0 0 0  Injury with Fall? 0 0 0 0 0  Comment - - - - -  Risk for fall due to : No Fall Risks - - - -  Follow up Falls prevention  discussed - - - -      Functional Status Survey: Is the patient deaf or have difficulty hearing?: No Does the patient have difficulty seeing, even when wearing glasses/contacts?: No Does the patient have difficulty concentrating, remembering, or making decisions?: Yes Does the patient have difficulty walking or climbing stairs?: No Does the patient have difficulty dressing or bathing?: No Does the patient have difficulty doing errands alone such as visiting a doctor's office or shopping?: No    Assessment & Plan  Problem List Items Addressed This Visit     Generalized anxiety disorder    Try Buspar 10 mg BID  Encouraged counseling and meditation Follow up in 6 weeks after initiating Buspar  May take Xanax for intractable panic attacks not reduced by Buspar and relaxation techniques.        Relevant Medications   busPIRone (BUSPAR) 10 MG tablet   ALPRAZolam (XANAX) 0.5 MG tablet   Dyslipidemia    Continue statins Continue walking and exercise Follow up at next visit      Benign hypertension    Continue current medications Continue walking  Follow up in 6 months      Tobacco use disorder    Patient is still smoking but stress and anxiety of current family health issues is not amenable to begin smoking cessation Encouraged anxiety management and continued efforts to reduce number of cigarettes per day as she is able.  Follow up at next apt.       Atherosclerosis of aorta (H[facility])   Centrilobular emphysema (H[facility])   Personal history of tobacco use, presenting hazards to health   Major depression ([place]) - Primary   Relevant Medications   busPIRone (BUSPAR) 10 MG tablet   ALPRAZolam (XANAX) 0.5 MG tablet   Metabolic syndrome   Other Visit Diagnoses     Need for  shingles vaccine       Relevant Orders   Varicella-zoster vaccine IM (Shingrix) (Completed)   Need for immunization against influenza       Relevant Orders   Flu Vaccine QUAD High Dose(Fluad) (Completed)

## 2021-01-06 ENCOUNTER — Ambulatory Visit: Payer: Federal, State, Local not specified - PPO | Admitting: Family Medicine

## 2021-01-06 ENCOUNTER — Other Ambulatory Visit: Payer: Self-pay

## 2021-01-06 ENCOUNTER — Encounter: Payer: Self-pay | Admitting: Family Medicine

## 2021-01-06 VITALS — BP 122/68 | HR 89 | Temp 97.7°F | Resp 16 | Ht 63.0 in | Wt 206.0 lb

## 2021-01-06 DIAGNOSIS — F330 Major depressive disorder, recurrent, mild: Secondary | ICD-10-CM | POA: Diagnosis not present

## 2021-01-06 DIAGNOSIS — Z87891 Personal history of nicotine dependence: Secondary | ICD-10-CM

## 2021-01-06 DIAGNOSIS — I700 Atherosclerosis of aorta: Secondary | ICD-10-CM

## 2021-01-06 DIAGNOSIS — Z23 Encounter for immunization: Secondary | ICD-10-CM

## 2021-01-06 DIAGNOSIS — J432 Centrilobular emphysema: Secondary | ICD-10-CM

## 2021-01-06 DIAGNOSIS — F17200 Nicotine dependence, unspecified, uncomplicated: Secondary | ICD-10-CM

## 2021-01-06 DIAGNOSIS — E8881 Metabolic syndrome: Secondary | ICD-10-CM

## 2021-01-06 DIAGNOSIS — E785 Hyperlipidemia, unspecified: Secondary | ICD-10-CM

## 2021-01-06 DIAGNOSIS — F411 Generalized anxiety disorder: Secondary | ICD-10-CM

## 2021-01-06 DIAGNOSIS — I10 Essential (primary) hypertension: Secondary | ICD-10-CM

## 2021-01-06 MED ORDER — BUSPIRONE HCL 10 MG PO TABS: 10.0000 mg | ORAL_TABLET | Freq: Two times a day (BID) | ORAL | 0 refills | Status: DC

## 2021-01-06 MED ORDER — ALPRAZOLAM 0.5 MG PO TABS: 0.5000 mg | ORAL_TABLET | ORAL | 0 refills | Status: DC | PRN

## 2021-01-06 NOTE — Assessment & Plan Note (Addendum)
Try Buspar 10 mg BID  Encouraged counseling and meditation Follow up in 6 weeks after initiating Buspar  May take Xanax for intractable panic attacks not reduced by Buspar and relaxation techniques.

## 2021-01-06 NOTE — Assessment & Plan Note (Signed)
Continue statins Continue walking and exercise Follow up at next visit

## 2021-01-06 NOTE — Assessment & Plan Note (Signed)
Continue current medications Continue walking  Follow up in 6 months

## 2021-01-06 NOTE — Assessment & Plan Note (Signed)
Patient is still smoking but stress and anxiety of current family health issues is not amenable to begin smoking cessation Encouraged anxiety management and continued efforts to reduce number of cigarettes per day as she is able.  Follow up at next apt.

## 2021-01-28 ENCOUNTER — Other Ambulatory Visit: Payer: Self-pay | Admitting: Family Medicine

## 2021-01-28 DIAGNOSIS — F411 Generalized anxiety disorder: Secondary | ICD-10-CM

## 2021-03-06 ENCOUNTER — Ambulatory Visit (INDEPENDENT_AMBULATORY_CARE_PROVIDER_SITE_OTHER): Payer: Federal, State, Local not specified - PPO | Admitting: Family Medicine

## 2021-03-06 ENCOUNTER — Encounter: Payer: Self-pay | Admitting: Family Medicine

## 2021-03-06 VITALS — BP 108/62 | HR 99 | Temp 97.9°F | Resp 16 | Ht 63.0 in | Wt 208.8 lb

## 2021-03-06 DIAGNOSIS — Z1231 Encounter for screening mammogram for malignant neoplasm of breast: Secondary | ICD-10-CM

## 2021-03-06 DIAGNOSIS — Z122 Encounter for screening for malignant neoplasm of respiratory organs: Secondary | ICD-10-CM | POA: Diagnosis not present

## 2021-03-06 DIAGNOSIS — Z0000 Encounter for general adult medical examination without abnormal findings: Secondary | ICD-10-CM | POA: Diagnosis not present

## 2021-03-06 NOTE — Patient Instructions (Signed)
Preventive Care 65 Years and Older, Female °Preventive care refers to lifestyle choices and visits with your health care provider that can promote health and wellness. Preventive care visits are also called wellness exams. °What can I expect for my preventive care visit? °Counseling °Your health care provider may ask you questions about your: °Medical history, including: °Past medical problems. °Family medical history. °Pregnancy and menstrual history. °History of falls. °Current health, including: °Memory and ability to understand (cognition). °Emotional well-being. °Home life and relationship well-being. °Sexual activity and sexual health. °Lifestyle, including: °Alcohol, nicotine or tobacco, and drug use. °Access to firearms. °Diet, exercise, and sleep habits. °Work and work environment. °Sunscreen use. °Safety issues such as seatbelt and bike helmet use. °Physical exam °Your health care provider will check your: °Height and weight. These may be used to calculate your BMI (body mass index). BMI is a measurement that tells if you are at a healthy weight. °Waist circumference. This measures the distance around your waistline. This measurement also tells if you are at a healthy weight and may help predict your risk of certain diseases, such as type 2 diabetes and high blood pressure. °Heart rate and blood pressure. °Body temperature. °Skin for abnormal spots. °What immunizations do I need? °Vaccines are usually given at various ages, according to a schedule. Your health care provider will recommend vaccines for you based on your age, medical history, and lifestyle or other factors, such as travel or where you work. °What tests do I need? °Screening °Your health care provider may recommend screening tests for certain conditions. This may include: °Lipid and cholesterol levels. °Hepatitis C test. °Hepatitis B test. °HIV (human immunodeficiency virus) test. °STI (sexually transmitted infection) testing, if you are at  risk. °Lung cancer screening. °Colorectal cancer screening. °Diabetes screening. This is done by checking your blood sugar (glucose) after you have not eaten for a while (fasting). °Mammogram. Talk with your health care provider about how often you should have regular mammograms. °BRCA-related cancer screening. This may be done if you have a family history of breast, ovarian, tubal, or peritoneal cancers. °Bone density scan. This is done to screen for osteoporosis. °Talk with your health care provider about your test results, treatment options, and if necessary, the need for more tests. °Follow these instructions at home: °Eating and drinking ° °Eat a diet that includes fresh fruits and vegetables, whole grains, lean protein, and low-fat dairy products. Limit your intake of foods with high amounts of sugar, saturated fats, and salt. °Take vitamin and mineral supplements as recommended by your health care provider. °Do not drink alcohol if your health care provider tells you not to drink. °If you drink alcohol: °Limit how much you have to 0-1 drink a day. °Know how much alcohol is in your drink. In the [place] one drink equals one 12 oz bottle of beer (355 mL), one 5 oz glass of wine (148 mL), or one 1½ oz glass of hard liquor (44 mL). °Lifestyle °Brush your teeth every morning and night with fluoride toothpaste. Floss one time each day. °Exercise for at least 30 minutes 5 or more days each week. °Do not use any products that contain nicotine or tobacco. These products include cigarettes, chewing tobacco, and vaping devices, such as e-cigarettes. If you need help quitting, ask your health care provider. °Do not use drugs. °If you are sexually active, practice safe sex. Use a condom or other form of protection in order to prevent STIs. °Take aspirin only as told by your   health care provider. Make sure that you understand how much to take and what form to take. Work with your health care provider to find out whether it  is safe and beneficial for you to take aspirin daily. Ask your health care provider if you need to take a cholesterol-lowering medicine (statin). Find healthy ways to manage stress, such as: Meditation, yoga, or listening to music. Journaling. Talking to a trusted person. Spending time with friends and family. Minimize exposure to UV radiation to reduce your risk of skin cancer. Safety Always wear your seat belt while driving or riding in a vehicle. Do not drive: If you have been drinking alcohol. Do not ride with someone who has been drinking. When you are tired or distracted. While texting. If you have been using any mind-altering substances or drugs. Wear a helmet and other protective equipment during sports activities. If you have firearms in your house, make sure you follow all gun safety procedures. What's next? Visit your health care provider once a year for an annual wellness visit. Ask your health care provider how often you should have your eyes and teeth checked. Stay up to date on all vaccines. This information is not intended to replace advice given to you by your health care provider. Make sure you discuss any questions you have with your health care provider. Document Revised: 07/30/2020 Document Reviewed: 07/30/2020 Elsevier Patient Education  [place].

## 2021-03-06 NOTE — Progress Notes (Signed)
Name: Veronica Mendez   MRN: [id number]    DOB: 07-25-1950   Date:03/06/2021       Progress Note  Subjective  Chief Complaint  Annual Exam  HPI  Patient presents for annual CPE.  Diet: balanced  Exercise: continue regular activity    Flowsheet Row Office Visit from 03/06/2021 in [facility]  AUDIT-C Score 2      Depression: Phq 9 is  positive - she states taking medication -  she states out of buspar, she did not know she had a refill  Depression screen [facility] 2/9 03/06/2021 01/06/2021 10/06/2020 07/04/2020 01/01/2020  Decreased Interest '2 1 1 2 ' 0  Down, Depressed, Hopeless 1 1 0 2 0  PHQ - 2 Score '3 2 1 4 ' 0  Altered sleeping '3 2 2 3 ' 0  Tired, decreased energy 1 0 0 3 1  Change in appetite 0 0 0 0 1  Feeling bad or failure about yourself  1 0 0 1 0  Trouble concentrating '3 2 1 3 ' 0  Moving slowly or fidgety/restless 3 0 0 0 0  Suicidal thoughts 0 0 0 0 0  PHQ-9 Score '14 6 4 14 2  ' Difficult doing work/chores Somewhat difficult - Not difficult at all - Not difficult at all  Some recent data might be hidden   Hypertension: BP Readings from Last 3 Encounters:  03/06/21 108/62  01/06/21 122/68  10/06/20 120/68   Obesity: Wt Readings from Last 3 Encounters:  03/06/21 208 lb 12.8 oz (94.7 kg)  01/06/21 206 lb (93.4 kg)  10/06/20 201 lb 14.4 oz (91.6 kg)   BMI Readings from Last 3 Encounters:  03/06/21 36.99 kg/m  01/06/21 36.49 kg/m  10/06/20 35.76 kg/m     Vaccines:   Shingrix: up to date COVID-19: up to date Pneumonia: up to date  Flu: up to date Tdap: up to date   Hep C Screening: 04/15/12 STD testing and prevention (HIV/chl/gon/syphilis): N/A Intimate partner violence: negative Sexual History : not sexually active  Menstrual History/LMP/Abnormal Bleeding: s/p hysterectomy  Incontinence Symptoms: she has urinary frequency in the mornings, usually after she takes bp medication   Breast cancer:  - Last Mammogram: 08/22/20 - BRCA gene  screening: N/A  Osteoporosis: Discussed high calcium and vitamin D supplementation, weight bearing exercises  Cervical cancer screening: N/A  Skin cancer: Discussed monitoring for atypical lesions  Colorectal cancer: 09/15/20   Lung cancer: Low Dose CT Chest recommended if Age 78-80 years, 20 pack-year currently smoking OR have quit w/in 15years. Patient does qualify.  She is due for repeat in April  ECG: 06/27/20  Advanced Care Planning: A voluntary discussion about advance care planning including the explanation and discussion of advance directives.  Discussed health care proxy and Living will, and the patient was able to identify a health care proxy as daughter .  Patient does have a living will at present time. If patient does have living will, I have requested they bring this to the clinic to be scanned in to their chart.  Lipids: Lab Results  Component Value Date   CHOL 146 07/04/2020   CHOL 174 08/23/2018   CHOL 159 07/13/2017   Lab Results  Component Value Date   HDL 52 07/04/2020   HDL 64 08/23/2018   HDL 52 07/13/2017   Lab Results  Component Value Date   LDLCALC 76 07/04/2020   LDLCALC 91 08/23/2018   LDLCALC 87 07/13/2017   Lab Results  Component Value  Date   TRIG 92 07/04/2020   TRIG 96 08/23/2018   TRIG 107 07/13/2017   Lab Results  Component Value Date   CHOLHDL 2.8 07/04/2020   CHOLHDL 2.7 08/23/2018   CHOLHDL 3.1 07/13/2017   No results found for: LDLDIRECT  Glucose: Glucose, Bld  Date Value Ref Range Status  06/23/2020 102 (H) 70 - 99 mg/dL Final    Comment:    Glucose reference range applies only to samples taken after fasting for at least 8 hours.  08/23/2018 105 (H) 65 - 99 mg/dL Final    Comment:    .            Fasting reference interval . For someone without known diabetes, a glucose value between 100 and 125 mg/dL is consistent with prediabetes and should be confirmed with a follow-up test. .   07/13/2017 96 65 - 139 mg/dL Final     Comment:    .        Non-fasting reference interval .     Patient Active Problem List   Diagnosis Date Noted   Morbid obesity, unspecified obesity type ([place]) 06/28/2018   Depression, major, recurrent, mild ([place]) 06/28/2018   Coronary artery calcification 09/06/2016   Lung nodule, solitary 07/30/2016   Atherosclerosis of aorta ([place]) 07/30/2016   Centrilobular emphysema ([place]) 07/30/2016   Personal history of tobacco use, presenting hazards to health 07/30/2016   Binge eating disorder 11/10/2015   Hyperglycemia 07/09/2015   Radiculitis of left cervical region 10/28/2014   Vitamin D deficiency 10/26/2014   Lumbosacral spondylosis without myelopathy 10/26/2014   Extreme obesity 10/26/2014   De Quervain's disease (radial styloid tenosynovitis) 10/26/2014   Nephrolithiasis 10/23/2014   Trigger finger, right 10/23/2014   Carpal tunnel syndrome 10/23/2014   Generalized anxiety disorder 10/23/2014   Major depression ([place]) 10/23/2014   Fibromyalgia 10/23/2014   Perennial allergic rhinitis 10/23/2014   Dyslipidemia 10/23/2014   Benign hypertension 10/23/2014   Morbid drug-induced obesity 10/23/2014   Insomnia 40/34/7425   Metabolic syndrome 95/63/8756   Tobacco use disorder 10/23/2014   Menopause syndrome 10/23/2014   Lumbago 10/23/2014   Osteoarthritis 10/23/2014   Atrophic vaginitis 10/23/2014    Past Surgical History:  Procedure Laterality Date   ABDOMINAL HYSTERECTOMY Bilateral 1988   COLONOSCOPY N/A 09/15/2020   Procedure: COLONOSCOPY;  Surgeon: Jonathon Bellows, MD;  Location: [facility] ENDOSCOPY;  Service: Gastroenterology;  Laterality: N/A;   KNEE SURGERY Left 2014    Family History  Problem Relation Age of Onset   Osteoarthritis Mother    Stroke Mother    Heart disease Father    Peripheral Artery Disease Sister    Diabetes Brother    Heart attack Brother    Diabetes Maternal Grandfather    Heart attack Paternal Uncle    Heart attack Paternal Uncle    Heart attack  Paternal Uncle    Heart attack Paternal Uncle    Heart attack Paternal Uncle    Heart attack Paternal Uncle    Heart attack Paternal Uncle    Heart attack Brother    Atrial fibrillation Brother    Heart Problems Brother    Heart attack Brother    Stroke Brother    Heart disease Brother    Heart disease Other     Social History   Socioeconomic History   Marital status: Married    Spouse name: [name]   Number of children: 1   Years of education: Not on file   Highest education level: Some  college, no degree  Occupational History   Occupation: Retired  Tobacco Use   Smoking status: Every Day    Packs/day: 1.00    Years: 50.00    Pack years: 50.00    Types: Cigarettes    Start date: 05/26/2016   Smokeless tobacco: Never   Tobacco comments:    down to 10 cigarettes daily   Vaping Use   Vaping Use: Never used  Substance and Sexual Activity   Alcohol use: Not Currently    Alcohol/week: 0.0 standard drinks   Drug use: No   Sexual activity: Yes    Partners: Male  Other Topics Concern   Not on file  Social History Narrative   Not on file   Social Determinants of Health   Financial Resource Strain: Low Risk    Difficulty of Paying Living Expenses: Not hard at all  Food Insecurity: No Food Insecurity   Worried About Charity fundraiser in the Last Year: Never true   [place] in the Last Year: Never true  Transportation Needs: No Transportation Needs   Lack of Transportation (Medical): No   Lack of Transportation (Non-Medical): No  Physical Activity: Sufficiently Active   Days of Exercise per Week: 5 days   Minutes of Exercise per Session: 30 min  Stress: Stress Concern Present   Feeling of Stress : Very much  Social Connections: Moderately Integrated   Frequency of Communication with Friends and Family: Once a week   Frequency of Social Gatherings with Friends and Family: Once a week   Attends Religious Services: 1 to 4 times per year   Active Member of  [employer] or Organizations: Yes   Attends Archivist Meetings: 1 to 4 times per year   Marital Status: Married  Human resources officer Violence: Not At Risk   Fear of Current or Ex-Partner: No   Emotionally Abused: No   Physically Abused: No   Sexually Abused: No     Current Outpatient Medications:    ALPRAZolam (XANAX) 0.5 MG tablet, Take 1 tablet (0.5 mg total) by mouth as needed for anxiety., Disp: 25 tablet, Rfl: 0   busPIRone (BUSPAR) 10 MG tablet, TAKE 1 TABLET BY MOUTH TWICE A DAY, Disp: 180 tablet, Rfl: 1   calcium carbonate (TUMS - DOSED IN MG ELEMENTAL CALCIUM) 500 MG chewable tablet, Chew 1 tablet by mouth as needed for indigestion or heartburn., Disp: , Rfl:    clindamycin (CLINDAGEL) 1 % gel, Apply topically 2 (two) times daily., Disp: 30 g, Rfl: 0   Fluticasone-Umeclidin-Vilant (TRELEGY ELLIPTA) 100-62.5-25 MCG/INH AEPB, Inhale 1 puff into the lungs daily., Disp: 1 each, Rfl: 11   metroNIDAZOLE (METROGEL) 0.75 % gel, Apply topically 2 (two) times daily., Disp: , Rfl:    olmesartan-hydrochlorothiazide (BENICAR HCT) 40-12.5 MG tablet, Take 1 tablet by mouth daily., Disp: 90 tablet, Rfl: 1   rosuvastatin (CRESTOR) 40 MG tablet, Take 1 tablet (40 mg total) by mouth daily. In place of 20 mg, Disp: 90 tablet, Rfl: 1   Vitamin D, Cholecalciferol, 1000 units TABS, Take 1 tablet by mouth daily. (Patient not taking: Reported on 03/06/2021), Disp: , Rfl:   No Known Allergies   ROS  Constitutional: Negative for fever or weight change.  Respiratory: Negative for cough and shortness of breath.   Cardiovascular: Negative for chest pain or palpitations.  Gastrointestinal: Negative for abdominal pain, no bowel changes.  Musculoskeletal: Negative for gait problem or joint swelling.  Skin: Negative for rash.  Neurological:  Negative for dizziness or headache.  No other specific complaints in a complete review of systems (except as listed in HPI above).   Objective  Vitals:   03/06/21  1100  BP: 108/62  Pulse: 99  Resp: 16  Temp: 97.9 F (36.6 C)  TempSrc: Oral  SpO2: 98%  Weight: 208 lb 12.8 oz (94.7 kg)  Height: '5\' 3"'  (1.6 m)    Body mass index is 36.99 kg/m.  Physical Exam  Constitutional: Patient appears well-developed and well-nourished. No distress.  HENT: Head: Normocephalic and atraumatic. Ears: B TMs ok, no erythema or effusion; Nose: Not done . Mouth/Throat: not done  Eyes: Conjunctivae and EOM are normal. Pupils are equal, round, and reactive to light. No scleral icterus.  Neck: Normal range of motion. Neck supple. No JVD present. No thyromegaly present.  Cardiovascular: Normal rate, regular rhythm and normal heart sounds.  No murmur heard. No BLE edema. Pulmonary/Chest: Effort normal and breath sounds normal. No respiratory distress. Abdominal: Soft. Bowel sounds are normal, no distension. There is no tenderness. no masses Breast: no lumps or masses, no nipple discharge or rashes FEMALE GENITALIA:  Not done  RECTAL: not done  Musculoskeletal: Normal range of motion, no joint effusions. No gross deformities Neurological: he is alert and oriented to person, place, and time. No cranial nerve deficit. Coordination, balance, strength, speech and gait are normal.  Skin: Skin is warm and dry. No rash noted. No erythema. Small cyst/versus black head on anterior chest wall between breasts Psychiatric: Patient has a normal mood and affect. behavior is normal. Judgment and thought content normal.   Fall Risk: Fall Risk  03/06/2021 01/06/2021 10/06/2020 07/04/2020 01/01/2020  Falls in the past year? 0 0 0 0 0  Number falls in past yr: 0 0 0 0 0  Injury with Fall? 0 0 0 0 0  Comment - - - - -  Risk for fall due to : No Fall Risks No Fall Risks - - -  Follow up Falls prevention discussed Falls prevention discussed - - -     Functional Status Survey: Is the patient deaf or have difficulty hearing?: No Does the patient have difficulty seeing, even when  wearing glasses/contacts?: No Does the patient have difficulty concentrating, remembering, or making decisions?: Yes Does the patient have difficulty walking or climbing stairs?: No Does the patient have difficulty dressing or bathing?: No Does the patient have difficulty doing errands alone such as visiting a doctor's office or shopping?: No   Assessment & Plan  1. Well adult exam   2. Breast cancer screening by mammogram  [facility] to [facility]  3. Screening for lung cancer  - Ambulatory Referral Lung Cancer Screening [facility]   -USPSTF grade A and B recommendations reviewed with patient; age-appropriate recommendations, preventive care, screening tests, etc discussed and encouraged; healthy living encouraged; see AVS for patient education given to patient -Discussed importance of 150 minutes of physical activity weekly, eat two servings of fish weekly, eat one serving of tree nuts ( cashews, pistachios, pecans, almonds.Marland Kitchen) every other day, eat 6 servings of fruit/vegetables daily and drink plenty of water and avoid sweet beverages.

## 2021-04-01 ENCOUNTER — Other Ambulatory Visit: Payer: Self-pay | Admitting: Family Medicine

## 2021-04-01 DIAGNOSIS — I10 Essential (primary) hypertension: Secondary | ICD-10-CM

## 2021-04-01 DIAGNOSIS — I2510 Atherosclerotic heart disease of native coronary artery without angina pectoris: Secondary | ICD-10-CM

## 2021-04-01 DIAGNOSIS — I700 Atherosclerosis of aorta: Secondary | ICD-10-CM

## 2021-04-01 DIAGNOSIS — E785 Hyperlipidemia, unspecified: Secondary | ICD-10-CM

## 2021-04-03 ENCOUNTER — Other Ambulatory Visit: Payer: Self-pay | Admitting: *Deleted

## 2021-04-03 DIAGNOSIS — F17210 Nicotine dependence, cigarettes, uncomplicated: Secondary | ICD-10-CM

## 2021-04-03 DIAGNOSIS — Z87891 Personal history of nicotine dependence: Secondary | ICD-10-CM

## 2021-05-29 ENCOUNTER — Ambulatory Visit
Admission: RE | Admit: 2021-05-29 | Discharge: 2021-05-29 | Disposition: A | Discharge status: Home / Self Care | Payer: Federal, State, Local not specified - PPO | Source: Ambulatory Visit | Attending: Acute Care | Admitting: Acute Care

## 2021-05-29 ENCOUNTER — Other Ambulatory Visit: Payer: Self-pay

## 2021-05-29 DIAGNOSIS — F17210 Nicotine dependence, cigarettes, uncomplicated: Secondary | ICD-10-CM

## 2021-05-29 DIAGNOSIS — Z87891 Personal history of nicotine dependence: Secondary | ICD-10-CM | POA: Diagnosis present

## 2021-06-02 ENCOUNTER — Other Ambulatory Visit: Payer: Self-pay | Admitting: *Deleted

## 2021-06-02 DIAGNOSIS — Z122 Encounter for screening for malignant neoplasm of respiratory organs: Secondary | ICD-10-CM

## 2021-06-02 DIAGNOSIS — Z87891 Personal history of nicotine dependence: Secondary | ICD-10-CM

## 2021-06-02 DIAGNOSIS — F17210 Nicotine dependence, cigarettes, uncomplicated: Secondary | ICD-10-CM

## 2021-06-09 ENCOUNTER — Other Ambulatory Visit: Payer: Self-pay | Admitting: Family Medicine

## 2021-06-09 DIAGNOSIS — F411 Generalized anxiety disorder: Secondary | ICD-10-CM

## 2021-06-10 LAB — VITAMIN B12: Vitamin B-12: 679

## 2021-06-10 LAB — HEPATIC FUNCTION PANEL
ALT: 29 U/L (ref 7–35)
AST: 23 (ref 13–35)
Alkaline Phosphatase: 120 (ref 25–125)

## 2021-06-10 LAB — COMPREHENSIVE METABOLIC PANEL
Albumin: 4.3 (ref 3.5–5.0)
Calcium: 9.9 (ref 8.7–10.7)
Globulin: 2.4

## 2021-06-10 LAB — CBC AND DIFFERENTIAL
HCT: 43 (ref 36–46)
Hemoglobin: 14.9 (ref 12.0–16.0)
Platelets: 288 10*3/uL (ref 150–400)
WBC: 6.8

## 2021-06-10 LAB — TSH: TSH: 2.5 (ref <=5.90)

## 2021-06-10 LAB — CBC: RBC: 4.78 (ref 3.87–5.11)

## 2021-06-10 LAB — BASIC METABOLIC PANEL
Chloride: 104 (ref 99–108)
Glucose: 95
Potassium: 4.6 mEq/L (ref 3.5–5.1)
Sodium: 141 (ref 137–147)

## 2021-06-12 ENCOUNTER — Encounter: Payer: Self-pay | Admitting: Family Medicine

## 2021-06-25 ENCOUNTER — Other Ambulatory Visit: Payer: Self-pay

## 2021-06-26 ENCOUNTER — Encounter: Payer: Self-pay | Admitting: Family Medicine

## 2021-07-06 ENCOUNTER — Ambulatory Visit: Payer: Federal, State, Local not specified - PPO | Admitting: Family Medicine

## 2021-07-30 ENCOUNTER — Other Ambulatory Visit: Payer: Self-pay

## 2021-08-03 ENCOUNTER — Encounter: Payer: Self-pay | Admitting: Family Medicine

## 2021-08-17 NOTE — Progress Notes (Signed)
Name: Veronica Mendez   MRN: [id number]    DOB: 01-21-1951   Date:08/19/2021       Progress Note  Subjective  Chief Complaint  Follow Up  HPI  HTN: Hypertension is well managed on current medications. Denies palpitations, chest pain,or lightheadedness.   Hyperlipidemia/Atherosclerosis of Aorta: Patient is taking statin therapy and denies side effects or concerns. Last LDL 76, taking higher dose of Rosuvastatin, she had liver enzymes checked by Dr. Nicolasa Ducking in April and normal. We will recheck labs next year   GAD/MDD and Insomnia: now under the care of Dr. Nicolasa Ducking, taking medications as prescribed and is doing well, she also sees a therapist.   Colonoscopy: Patient had colonoscopy 09/15/2020 with findings that indicate need for colonoscopy every 3 years. Unchanged   Smoking/ Emphysema/ lung nodule: Patient states she is still smoking and is currently at approx 5  cigarettes per day. She still has mild, intermittent cough several times per week. Denies SOB.She stopped using Trelegy , she states she only uses it prn - explained that once medication opened it only lasts 60 days She states she does not feel like she need medication. Explained we can change to another medication but she is not interested . She had CT lung in April and needs to go back in October for 6 month follow up due to a new lung nodule   Morbid Obesity:  weight stable, BMI above 35 with co-morbidities , such as HTN, dyslipidemia and atherosclerosis of aorta. She is trying to avoid carbs , discussed intermittent fasting since she likes to eat at night    Patient Active Problem List   Diagnosis Date Noted   Morbid obesity, unspecified obesity type ([place]) 06/28/2018   Depression, major, recurrent, mild ([place]) 06/28/2018   Coronary artery calcification 09/06/2016   Lung nodule, solitary 07/30/2016   Atherosclerosis of aorta ([place]) 07/30/2016   Centrilobular emphysema ([place]) 07/30/2016   Personal history of tobacco use, presenting  hazards to health 07/30/2016   Binge eating disorder 11/10/2015   Hyperglycemia 07/09/2015   Radiculitis of left cervical region 10/28/2014   Vitamin D deficiency 10/26/2014   Lumbosacral spondylosis without myelopathy 10/26/2014   Extreme obesity 10/26/2014   De Quervain's disease (radial styloid tenosynovitis) 10/26/2014   Nephrolithiasis 10/23/2014   Trigger finger, right 10/23/2014   Carpal tunnel syndrome 10/23/2014   Generalized anxiety disorder 10/23/2014   Major depression ([place]) 10/23/2014   Fibromyalgia 10/23/2014   Perennial allergic rhinitis 10/23/2014   Dyslipidemia 10/23/2014   Benign hypertension 10/23/2014   Morbid drug-induced obesity 10/23/2014   Insomnia 78/29/5621   Metabolic syndrome 30/86/5784   Tobacco use disorder 10/23/2014   Menopause syndrome 10/23/2014   Lumbago 10/23/2014   Osteoarthritis 10/23/2014   Atrophic vaginitis 10/23/2014    Past Surgical History:  Procedure Laterality Date   ABDOMINAL HYSTERECTOMY Bilateral 1988   COLONOSCOPY N/A 09/15/2020   Procedure: COLONOSCOPY;  Surgeon: Jonathon Bellows, MD;  Location[facility] ENDOSCOPY;  Service: Gastroenterology;  Laterality: N/A;   KNEE SURGERY Left 2014    Family History  Problem Relation Age of Onset   Osteoarthritis Mother    Stroke Mother    Heart disease Father    Peripheral Artery Disease Sister    Diabetes Brother    Heart attack Brother    Diabetes Maternal Grandfather    Heart attack Paternal Uncle    Heart attack Paternal Uncle    Heart attack Paternal Uncle    Heart attack Paternal Uncle    Heart  attack Paternal Uncle    Heart attack Paternal Uncle    Heart attack Paternal Uncle    Heart attack Brother    Atrial fibrillation Brother    Heart Problems Brother    Heart attack Brother    Stroke Brother    Heart disease Brother    Heart disease Other     Social History   Tobacco Use   Smoking status: Every Day    Packs/day: 1.00    Years: 50.00    Total pack years: 50.00     Types: Cigarettes    Start date: 05/26/2016   Smokeless tobacco: Never   Tobacco comments:    down to 10 cigarettes daily   Substance Use Topics   Alcohol use: Not Currently    Alcohol/week: 0.0 standard drinks of alcohol     Current Outpatient Medications:    busPIRone (BUSPAR) 15 MG tablet, Take 15 mg by mouth 2 (two) times daily., Disp: , Rfl:    calcium carbonate (TUMS - DOSED IN MG ELEMENTAL CALCIUM) 500 MG chewable tablet, Chew 1 tablet by mouth as needed for indigestion or heartburn., Disp: , Rfl:    olmesartan-hydrochlorothiazide (BENICAR HCT) 40-12.5 MG tablet, TAKE 1 TABLET BY MOUTH EVERY DAY, Disp: 90 tablet, Rfl: 1   rosuvastatin (CRESTOR) 40 MG tablet, TAKE 1 TABLET (40 MG TOTAL) BY MOUTH DAILY. IN PLACE OF 20 MG, Disp: 90 tablet, Rfl: 1   traZODone (DESYREL) 100 MG tablet, Take 100 mg by mouth at bedtime., Disp: , Rfl:    venlafaxine XR (EFFEXOR-XR) 75 MG 24 hr capsule, Take 75 mg by mouth daily with breakfast., Disp: , Rfl:    Vitamin D, Cholecalciferol, 1000 units TABS, Take 1 tablet by mouth daily., Disp: , Rfl:   No Known Allergies  I personally reviewed active problem list, medication list, allergies, family history, social history, health maintenance with the patient/caregiver today.   ROS  Constitutional: Negative for fever or weight change.  Respiratory: Negative for cough and shortness of breath.   Cardiovascular: Negative for chest pain or palpitations.  Gastrointestinal: Negative for abdominal pain, no bowel changes.  Musculoskeletal: Negative for gait problem or joint swelling.  Skin: Negative for rash.  Neurological: Negative for dizziness or headache.  No other specific complaints in a complete review of systems (except as listed in HPI above).   Objective  Vitals:   08/19/21 1410  BP: 112/70  Pulse: 96  Resp: 16  Temp: 98 F (36.7 C)  TempSrc: Oral  SpO2: 100%  Weight: 210 lb 12.8 oz (95.6 kg)  Height: '5\' 4"'$  (1.626 m)    Body mass index  is 36.18 kg/m.  Physical Exam  Constitutional: Patient appears well-developed and well-nourished. Obese  No distress.  HEENT: head atraumatic, normocephalic, pupils equal and reactive to light, neck supple Cardiovascular: Normal rate, regular rhythm and normal heart sounds.  No murmur heard. No BLE edema. Pulmonary/Chest: Effort normal and breath sounds normal. No respiratory distress. Abdominal: Soft.  There is no tenderness. Psychiatric: Patient has a normal mood and affect. behavior is normal. Judgment and thought content normal.   Recent Results (from the past 2160 hour(s))  CBC and differential     Status: None   Collection Time: 06/10/21 12:00 AM  Result Value Ref Range   Hemoglobin 14.9 12.0 - 16.0   HCT 43 36 - 46   Platelets 288 150 - 400 K/uL   WBC 6.8   CBC     Status: None  Collection Time: 06/10/21 12:00 AM  Result Value Ref Range   RBC 4.78 3.87 - 4.03  Basic metabolic panel     Status: None   Collection Time: 06/10/21 12:00 AM  Result Value Ref Range   Glucose 95    Potassium 4.6 3.5 - 5.1 mEq/L   Sodium 141 137 - 147   Chloride 104 99 - 108  Comprehensive metabolic panel     Status: None   Collection Time: 06/10/21 12:00 AM  Result Value Ref Range   Globulin 2.4    Calcium 9.9 8.7 - 10.7   Albumin 4.3 3.5 - 5.0  Hepatic function panel     Status: None   Collection Time: 06/10/21 12:00 AM  Result Value Ref Range   Alkaline Phosphatase 120 25 - 125   ALT 29 7 - 35 U/L   AST 23 13 - 35  Vitamin B12     Status: None   Collection Time: 06/10/21 12:00 AM  Result Value Ref Range   Vitamin B-12 679   TSH     Status: None   Collection Time: 06/10/21 12:00 AM  Result Value Ref Range   TSH 2.50 0.41 - 5.90    PHQ2/9:    08/19/2021    2:16 PM 03/06/2021   10:49 AM 01/06/2021   11:26 AM 10/06/2020   11:11 AM 07/04/2020    8:51 AM  Depression screen PHQ 2/9  Decreased Interest 0 '2 1 1 2  '$ Down, Depressed, Hopeless 0 1 1 0 2  PHQ - 2 Score 0 '3 2 1 4   '$ Altered sleeping 0 '3 2 2 3  '$ Tired, decreased energy 0 1 0 0 3  Change in appetite 0 0 0 0 0  Feeling bad or failure about yourself  0 1 0 0 1  Trouble concentrating 0 '3 2 1 3  '$ Moving slowly or fidgety/restless 0 3 0 0 0  Suicidal thoughts 0 0 0 0 0  PHQ-9 Score 0 '14 6 4 14  '$ Difficult doing work/chores  Somewhat difficult  Not difficult at all     phq 9 is negative   Fall Risk:    08/19/2021    2:16 PM 03/06/2021   10:49 AM 01/06/2021   11:26 AM 10/06/2020   11:10 AM 07/04/2020    8:51 AM  Fall Risk   Falls in the past year? 0 0 0 0 0  Number falls in past yr:  0 0 0 0  Injury with Fall?  0 0 0 0  Risk for fall due to : No Fall Risks No Fall Risks No Fall Risks    Follow up Falls prevention discussed Falls prevention discussed Falls prevention discussed        Functional Status Survey: Is the patient deaf or have difficulty hearing?: No Does the patient have difficulty seeing, even when wearing glasses/contacts?: No Does the patient have difficulty concentrating, remembering, or making decisions?: No Does the patient have difficulty walking or climbing stairs?: No Does the patient have difficulty dressing or bathing?: No Does the patient have difficulty doing errands alone such as visiting a doctor's office or shopping?: No    Assessment & Plan  1. Atherosclerosis of aorta (HCC)  - rosuvastatin (CRESTOR) 40 MG tablet; Take 1 tablet (40 mg total) by mouth daily.  Dispense: 90 tablet; Refill: 1  2. Major depression in remission [facility])  Doing well on current regiment   3. Centrilobular emphysema (HCC)  Not interested in therapy  4. Morbid obesity ([place])  Discussed with the patient the risk posed by an increased BMI. Discussed importance of portion control, calorie counting and at least 150 minutes of physical activity weekly. Avoid sweet beverages and drink more water. Eat at least 6 servings of fruit and vegetables daily    5. Coronary artery calcification  -  rosuvastatin (CRESTOR) 40 MG tablet; Take 1 tablet (40 mg total) by mouth daily.  Dispense: 90 tablet; Refill: 1  6. Benign hypertension  - olmesartan-hydrochlorothiazide (BENICAR HCT) 40-12.5 MG tablet; Take 1 tablet by mouth daily.  Dispense: 90 tablet; Refill: 1  7. Dyslipidemia  - rosuvastatin (CRESTOR) 40 MG tablet; Take 1 tablet (40 mg total) by mouth daily.  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

## 2021-08-19 ENCOUNTER — Ambulatory Visit: Payer: Federal, State, Local not specified - PPO | Admitting: Family Medicine

## 2021-08-19 ENCOUNTER — Encounter: Payer: Self-pay | Admitting: Family Medicine

## 2021-08-19 VITALS — BP 112/70 | HR 96 | Temp 98.0°F | Resp 16 | Ht 64.0 in | Wt 210.8 lb

## 2021-08-19 DIAGNOSIS — I700 Atherosclerosis of aorta: Secondary | ICD-10-CM | POA: Diagnosis not present

## 2021-08-19 DIAGNOSIS — J432 Centrilobular emphysema: Secondary | ICD-10-CM

## 2021-08-19 DIAGNOSIS — F5105 Insomnia due to other mental disorder: Secondary | ICD-10-CM

## 2021-08-19 DIAGNOSIS — F325 Major depressive disorder, single episode, in full remission: Secondary | ICD-10-CM | POA: Diagnosis not present

## 2021-08-19 DIAGNOSIS — E785 Hyperlipidemia, unspecified: Secondary | ICD-10-CM

## 2021-08-19 DIAGNOSIS — I2584 Coronary atherosclerosis due to calcified coronary lesion: Secondary | ICD-10-CM

## 2021-08-19 DIAGNOSIS — I10 Essential (primary) hypertension: Secondary | ICD-10-CM

## 2021-08-19 DIAGNOSIS — I2510 Atherosclerotic heart disease of native coronary artery without angina pectoris: Secondary | ICD-10-CM

## 2021-08-19 MED ORDER — OLMESARTAN MEDOXOMIL-HCTZ 40-12.5 MG PO TABS: 1.0000 | ORAL_TABLET | Freq: Every day | ORAL | 1 refills | Status: DC

## 2021-08-19 MED ORDER — ROSUVASTATIN CALCIUM 40 MG PO TABS: 40.0000 mg | ORAL_TABLET | Freq: Every day | ORAL | 1 refills | Status: DC

## 2021-10-30 ENCOUNTER — Other Ambulatory Visit: Payer: Self-pay

## 2021-11-04 ENCOUNTER — Ambulatory Visit (INDEPENDENT_AMBULATORY_CARE_PROVIDER_SITE_OTHER): Payer: Federal, State, Local not specified - PPO

## 2021-11-04 DIAGNOSIS — Z23 Encounter for immunization: Secondary | ICD-10-CM | POA: Diagnosis not present

## 2021-11-13 IMAGING — CT CT CHEST LCS NODULE FOLLOW-UP W/O CM
2 of 5 series · 15 of 40 positions shown, 18 images · non-contrast
Comparison: 02/01/2019 screening chest CT.

CLINICAL DATA: 69-year-old asymptomatic female current smoker
presenting for three-month follow-up.

EXAM:
CT CHEST WITHOUT CONTRAST FOR LUNG CANCER SCREENING NODULE FOLLOW-UP
TECHNIQUE: Multidetector CT imaging of the chest was performed following the
standard protocol without IV contrast.

[Series 3: lung lcs f/u 1.00 · axial · 0.80mm/px · z∈[-1203,-917]mm · 12 of 318 slices shown, 15 images]
[im 16/318  mediastinal]
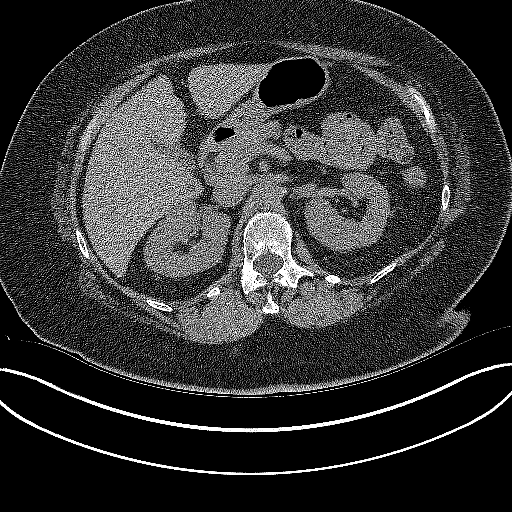
[im 16/318  lung]
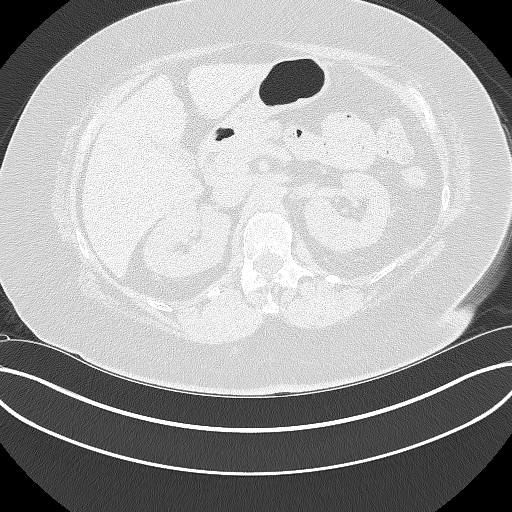
[im 46/318  lung]
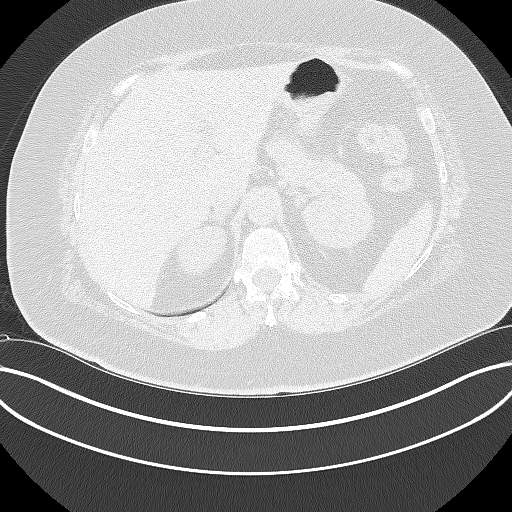
[im 76/318  lung]
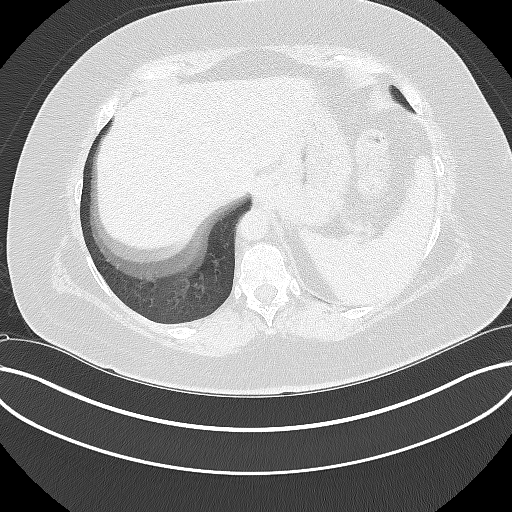
[im 91/318  lung]
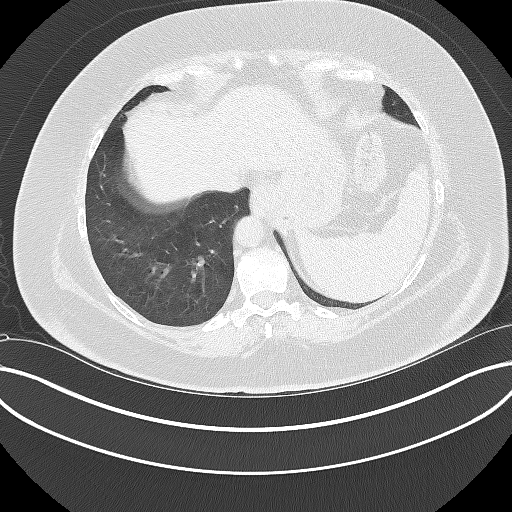
[im 121/318  mediastinal]
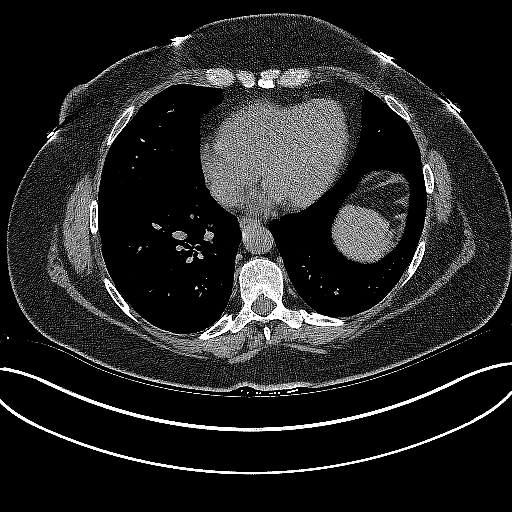
[im 121/318  lung]
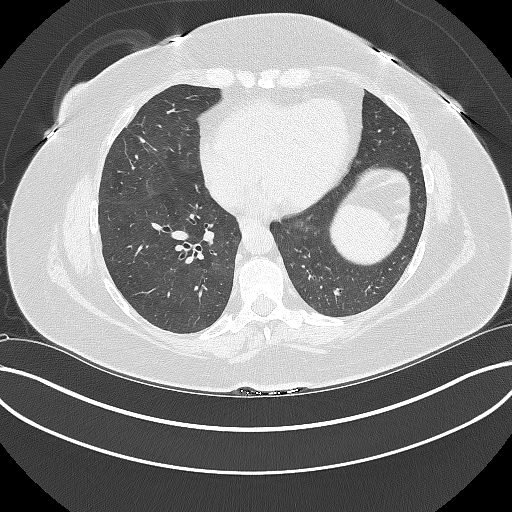
[im 151/318  lung]
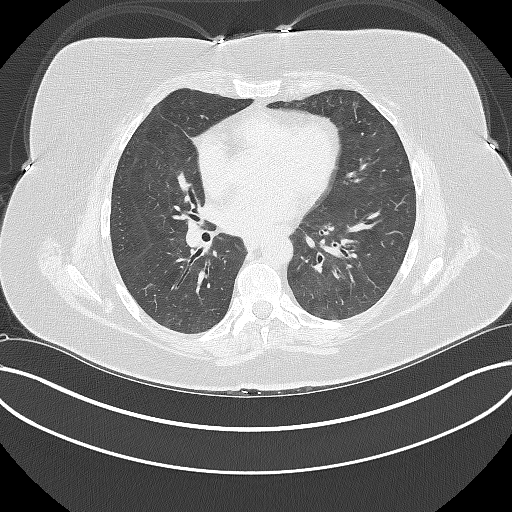
[im 167/318  lung]
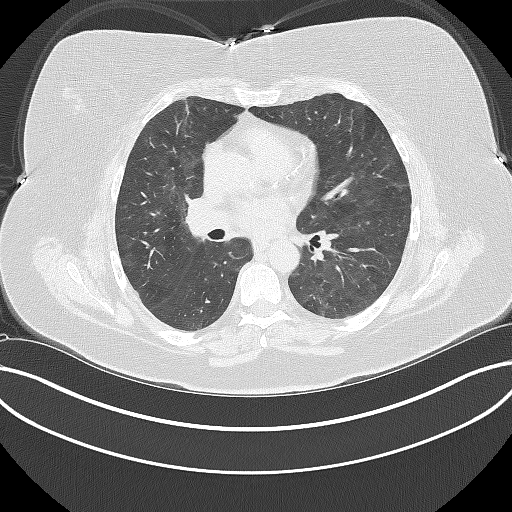
[im 197/318  lung]
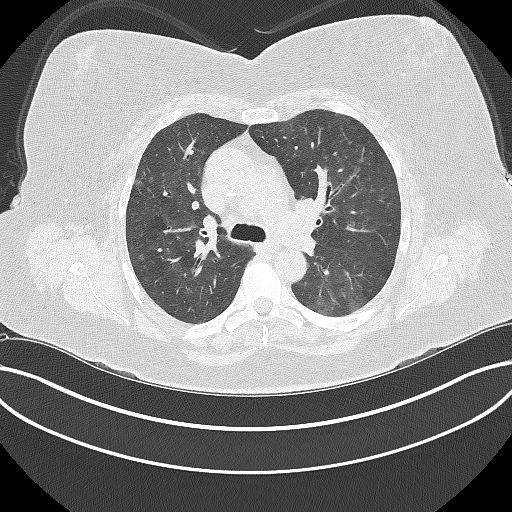
[im 227/318  mediastinal]
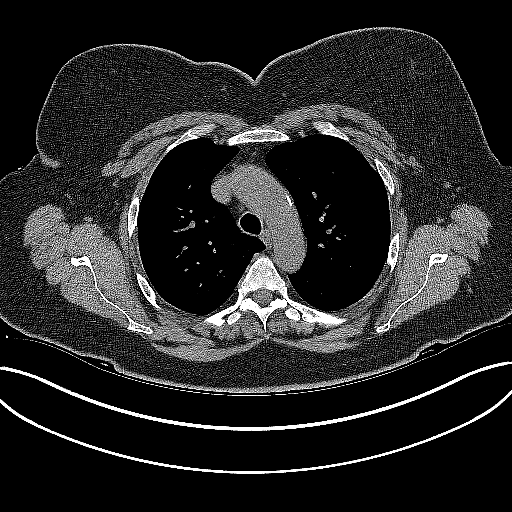
[im 227/318  lung]
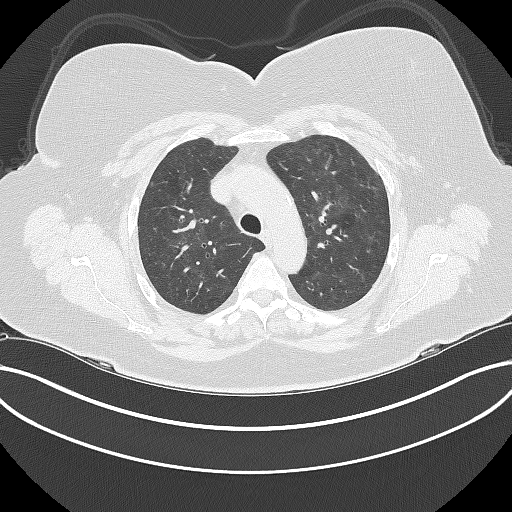
[im 242/318  lung]
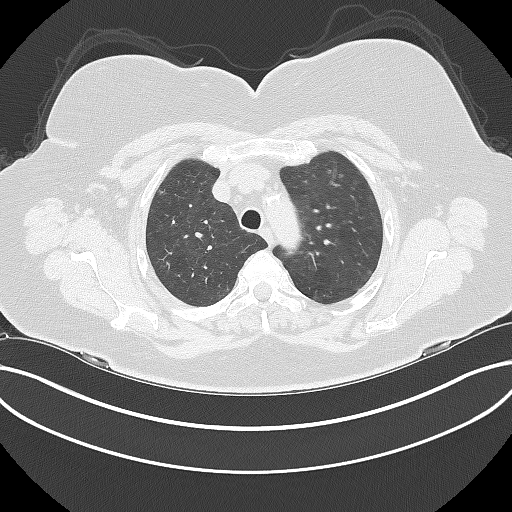
[im 272/318  lung]
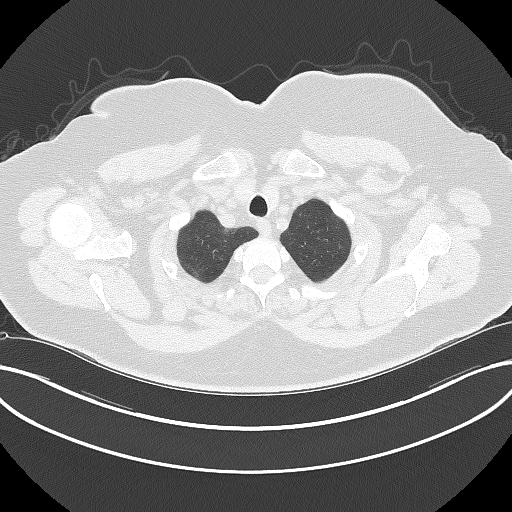
[im 302/318  lung]
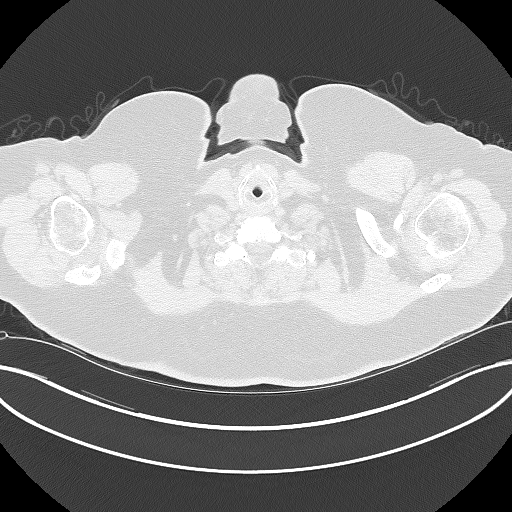

[Series 4: lcs f/u 1.00 cor · coronal · 0.62mm/px · 3 of 338 slices shown]
[im 68/338  lung]
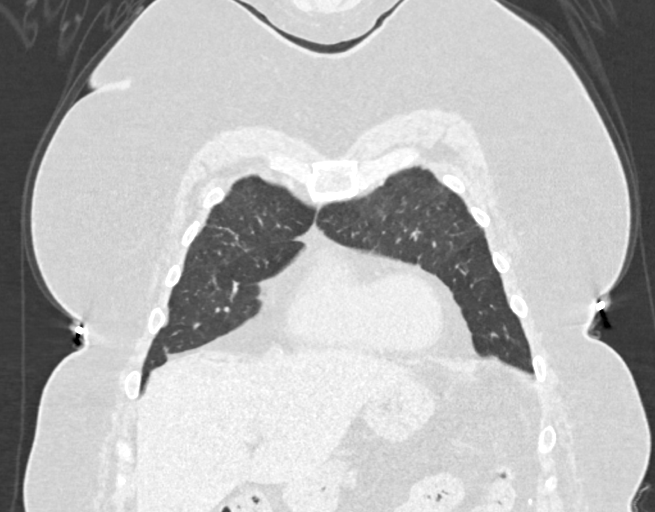
[im 135/338  lung]
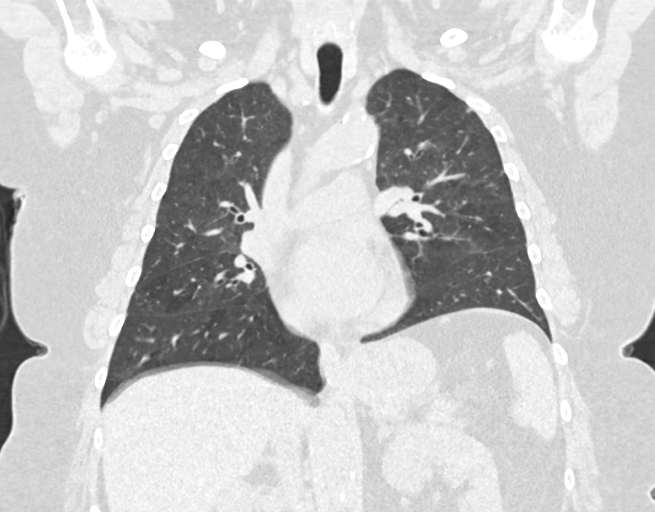
[im 203/338  lung]
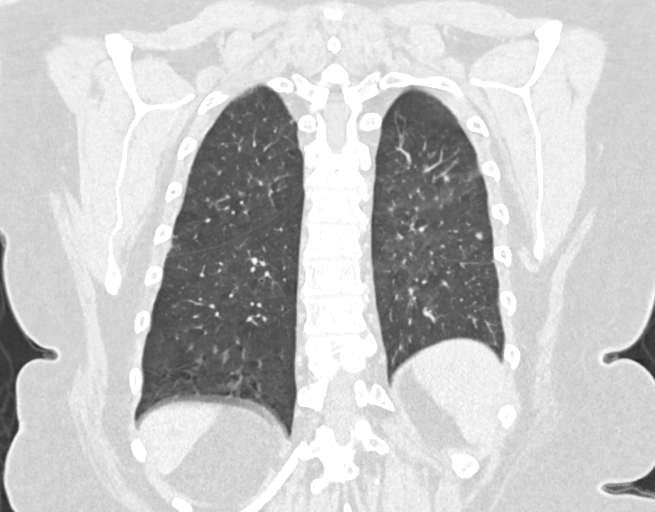

[15 of 40 positions shown; findings below may reference images not displayed]

FINDINGS: Cardiovascular: Normal heart size. No significant pericardial
effusion/thickening. Left anterior descending and left circumflex
coronary atherosclerosis. Atherosclerotic nonaneurysmal thoracic
aorta. Normal caliber pulmonary arteries.

Mediastinum/Nodes: No discrete thyroid nodules. Unremarkable
esophagus. No pathologically enlarged axillary, mediastinal or hilar
lymph nodes, noting limited sensitivity for the detection of hilar
adenopathy on this noncontrast study.

Lungs/Pleura: No pneumothorax. No pleural effusion. No acute
consolidative airspace disease or lung masses. Patchy cholecystitis
centrilobular ground-glass micronodularity in both lungs with an
upper lobe predominance, similar. Previously visualized numerous
scattered small pulmonary nodules are all stable or decreased in
size. No significant new or enlarging pulmonary nodules.

Upper abdomen: Small hiatal hernia. Simple scattered liver cysts,
largest 2.2 cm in the posterior right liver lobe. Nonobstructing 3
mm upper right renal stone.

Musculoskeletal: No aggressive appearing focal osseous lesions. Mild
thoracic spondylosis.
IMPRESSION: 1. Lung-RADS 2, benign appearance or behavior. Continue annual
screening with low-dose chest CT without contrast in 12 months.
2. Two-vessel coronary atherosclerosis.
3. Small hiatal hernia.
4. Nonobstructing right nephrolithiasis.
5. Aortic Atherosclerosis (JDX7D-T94.4).

## 2021-11-30 ENCOUNTER — Ambulatory Visit
Admission: RE | Admit: 2021-11-30 | Discharge: 2021-11-30 | Disposition: A | Discharge status: Home / Self Care | Payer: Federal, State, Local not specified - PPO | Source: Ambulatory Visit | Attending: Acute Care | Admitting: Acute Care

## 2021-11-30 DIAGNOSIS — Z87891 Personal history of nicotine dependence: Secondary | ICD-10-CM | POA: Insufficient documentation

## 2021-11-30 DIAGNOSIS — F17210 Nicotine dependence, cigarettes, uncomplicated: Secondary | ICD-10-CM | POA: Diagnosis present

## 2021-11-30 DIAGNOSIS — Z122 Encounter for screening for malignant neoplasm of respiratory organs: Secondary | ICD-10-CM | POA: Diagnosis present

## 2021-12-03 ENCOUNTER — Telehealth: Payer: Self-pay | Admitting: Acute Care

## 2021-12-03 DIAGNOSIS — R911 Solitary pulmonary nodule: Secondary | ICD-10-CM

## 2021-12-03 DIAGNOSIS — Z87891 Personal history of nicotine dependence: Secondary | ICD-10-CM

## 2021-12-03 NOTE — Telephone Encounter (Signed)
I have called the patient with the results of her low dose CT chest I explained that her scan was read as a lung RADS 3, probably benign findings, but recommendation is for a 67-monthfollow-up low-dose CT. Per radiology notes there are new bilateral solid pulmonary nodules the largest of which measures 4.2 mm in the right upper lobe.  Nodules were noted on previous CT have resolved reflective most likely of a waxing and waning atypical infectious process or recurrent aspiration.  The patient denies having any issues with choking on food.  She does state however that she has significant year around allergies that are currently flaring with the weather changes.  She is in agreement with a 667-monthollow-up low-dose CT.  Veronica Mendez please order 6-53-monthllow-up low-dose CT and fax results to PCP and let them know plan is for a 6-m43-monthlow-up low-dose CT.  Thanks so much

## 2021-12-03 NOTE — Telephone Encounter (Signed)
CT results faxed to DR [facility] with follow up plans included. Order placed for 6 mth nodule follow up CT.

## 2022-01-25 NOTE — Progress Notes (Unsigned)
Name: Veronica Mendez   MRN: [id number]    DOB: 16-Jan-1951   Date:01/26/2022       Progress Note  Subjective  Chief Complaint  Follow Up  HPI  HTN: BP is towards low end of normal . Denies palpitations, chest pain,or lightheadedness. She has a history of syncope and we will decrease dose of Benicar hctz from 40/12.5 to 20/12.5 and monitor   Hyperlipidemia/Atherosclerosis of Aorta/CAD : Patient is taking statin therapy and denies side effects or concerns. Last LDL 76, taking higher dose of Rosuvastatin, we will recheck labs today   GAD/MDD and Insomnia: now under the care of Dr. Nicolasa Ducking, taking medications as prescribed and is doing well, she is no longer seeing therapist since she went to be able to drive again after syncopal episode and has achieve that goal   Colonoscopy: Patient had colonoscopy 09/15/2020 with findings that indicate need for colonoscopy every 3 years.   Smoking/ Emphysema/ lung nodule: Patient states she is still smoking and is smoking up to 10  cigarettes per day again. . She states no sob, very seldom she has a cough but uses saline solution and occasionally takes sudafed ( explained it may raise bp , advised to change to coricidin HBP) She is only using Trelegy prn She states she does not feel like she need medication . She needs to repeat CT lung in April 2025 , new nodules  Morbid Obesity:  weight stable, BMI above 35 with co-morbidities , such as HTN, dyslipidemia and atherosclerosis of aorta. She has been eating more sweets than usual, going to more parties  Trigger fingers: both hands, painful, discussed referral to ortho, she states stretching has helped, we will try nsaid's for one week and prn after that   Patient Active Problem List   Diagnosis Date Noted   Morbid obesity ([place]) 06/28/2018   Mild episode of recurrent major depressive disorder ([place]) 06/28/2018   Coronary artery calcification 09/06/2016   Lung nodule, solitary 07/30/2016   Atherosclerosis of  aorta ([place]) 07/30/2016   Centrilobular emphysema ([place]) 07/30/2016   Personal history of tobacco use, presenting hazards to health 07/30/2016   Binge eating disorder 11/10/2015   Hyperglycemia 07/09/2015   Radiculitis of left cervical region 10/28/2014   Vitamin D deficiency 10/26/2014   Lumbosacral spondylosis without myelopathy 10/26/2014   Extreme obesity 10/26/2014   De Quervain's disease (radial styloid tenosynovitis) 10/26/2014   Nephrolithiasis 10/23/2014   Trigger finger, right 10/23/2014   Carpal tunnel syndrome 10/23/2014   Generalized anxiety disorder 10/23/2014   Major depression ([place]) 10/23/2014   Fibromyalgia 10/23/2014   Perennial allergic rhinitis 10/23/2014   Dyslipidemia 10/23/2014   Benign hypertension 10/23/2014   Morbid drug-induced obesity 10/23/2014   Insomnia 78/24/2353   Metabolic syndrome 61/44/3154   Tobacco use disorder 10/23/2014   Menopause syndrome 10/23/2014   Lumbago 10/23/2014   Osteoarthritis 10/23/2014   Atrophic vaginitis 10/23/2014    Past Surgical History:  Procedure Laterality Date   ABDOMINAL HYSTERECTOMY Bilateral 1988   COLONOSCOPY N/A 09/15/2020   Procedure: COLONOSCOPY;  Surgeon: Jonathon Bellows, MD;  Location: [facility] ENDOSCOPY;  Service: Gastroenterology;  Laterality: N/A;   KNEE SURGERY Left 2014    Family History  Problem Relation Age of Onset   Osteoarthritis Mother    Stroke Mother    Heart disease Father    Peripheral Artery Disease Sister    Diabetes Brother    Heart attack Brother    Diabetes Maternal Grandfather    Heart attack Paternal  Uncle    Heart attack Paternal Uncle    Heart attack Paternal Uncle    Heart attack Paternal Uncle    Heart attack Paternal Uncle    Heart attack Paternal Uncle    Heart attack Paternal Uncle    Heart attack Brother    Atrial fibrillation Brother    Heart Problems Brother    Heart attack Brother    Stroke Brother    Heart disease Brother    Heart disease Other     Social History    Tobacco Use   Smoking status: Every Day    Packs/day: 1.00    Years: 50.00    Total pack years: 50.00    Types: Cigarettes    Start date: 05/26/2016   Smokeless tobacco: Never   Tobacco comments:    down to 10 cigarettes daily   Substance Use Topics   Alcohol use: Not Currently    Alcohol/week: 0.0 standard drinks of alcohol     Current Outpatient Medications:    busPIRone (BUSPAR) 15 MG tablet, Take 15 mg by mouth 2 (two) times daily., Disp: , Rfl:    calcium carbonate (TUMS - DOSED IN MG ELEMENTAL CALCIUM) 500 MG chewable tablet, Chew 1 tablet by mouth as needed for indigestion or heartburn., Disp: , Rfl:    rosuvastatin (CRESTOR) 40 MG tablet, Take 1 tablet (40 mg total) by mouth daily., Disp: 90 tablet, Rfl: 1   traZODone (DESYREL) 100 MG tablet, Take 100 mg by mouth at bedtime., Disp: , Rfl:    Vitamin D, Cholecalciferol, 1000 units TABS, Take 1 tablet by mouth daily., Disp: , Rfl:   No Known Allergies  I personally reviewed active problem list, medication list, allergies, family history, social history, health maintenance with the patient/caregiver today.   ROS  Constitutional: Negative for fever , positive for  weight change.  Respiratory: positive  for intermittent  cough but no  shortness of breath.   Cardiovascular: Negative for chest pain or palpitations.  Gastrointestinal: Negative for abdominal pain, no bowel changes.  Musculoskeletal: Negative for gait problem or joint swelling.  Skin: Negative for rash.  Neurological: Negative for dizziness or headache.  No other specific complaints in a complete review of systems (except as listed in HPI above).   Objective  Vitals:   01/26/22 1130  BP: 112/72  Pulse: 92  Resp: 16  Temp: 97.7 F (36.5 C)  TempSrc: Oral  SpO2: 96%  Weight: 222 lb 9.6 oz (101 kg)  Height: '5\' 3"'$  (1.6 m)    Body mass index is 39.43 kg/m.  Physical Exam  Constitutional: Patient appears well-developed and well-nourished. Obese   No distress.  HEENT: head atraumatic, normocephalic, pupils equal and reactive to light, neck supple Cardiovascular: Normal rate, regular rhythm and normal heart sounds.  No murmur heard. No BLE edema. Pulmonary/Chest: Effort normal and breath sounds normal. No respiratory distress. Abdominal: Soft.  There is no tenderness. Psychiatric: Patient has a normal mood and affect. behavior is normal. Judgment and thought content normal.    PHQ2/9:    01/26/2022   11:32 AM 08/19/2021    2:16 PM 03/06/2021   10:49 AM 01/06/2021   11:26 AM 10/06/2020   11:11 AM  Depression screen PHQ 2/9  Decreased Interest 0 0 '2 1 1  '$ Down, Depressed, Hopeless 0 0 1 1 0  PHQ - 2 Score 0 0 '3 2 1  '$ Altered sleeping 0 0 '3 2 2  '$ Tired, decreased energy 0 0 1 0  0  Change in appetite 1 0 0 0 0  Feeling bad or failure about yourself  0 0 1 0 0  Trouble concentrating 0 0 '3 2 1  '$ Moving slowly or fidgety/restless 0 0 3 0 0  Suicidal thoughts 0 0 0 0 0  PHQ-9 Score 1 0 '14 6 4  '$ Difficult doing work/chores Not difficult at all  Somewhat difficult  Not difficult at all    phq 9 is negative   Fall Risk:    01/26/2022   11:32 AM 08/19/2021    2:16 PM 03/06/2021   10:49 AM 01/06/2021   11:26 AM 10/06/2020   11:10 AM  [place] in the past year? 0 0 0 0 0  Number falls in past yr:   0 0 0  Injury with Fall?   0 0 0  Risk for fall due to : No Fall Risks No Fall Risks No Fall Risks No Fall Risks   Follow up Falls prevention discussed;Education provided;Falls evaluation completed Falls prevention discussed Falls prevention discussed Falls prevention discussed       Functional Status Survey: Is the patient deaf or have difficulty hearing?: No Does the patient have difficulty seeing, even when wearing glasses/contacts?: No Does the patient have difficulty concentrating, remembering, or making decisions?: No Does the patient have difficulty walking or climbing stairs?: No Does the patient have difficulty dressing  or bathing?: No Does the patient have difficulty doing errands alone such as visiting a doctor's office or shopping?: No    Assessment & Plan  1. Atherosclerosis of aorta (HCC)  - rosuvastatin (CRESTOR) 40 MG tablet; Take 1 tablet (40 mg total) by mouth daily.  Dispense: 90 tablet; Refill: 1 - Lipid panel  2. Morbid obesity ([place])  Discussed with the patient the risk posed by an increased BMI. Discussed importance of portion control, calorie counting and at least 150 minutes of physical activity weekly. Avoid sweet beverages and drink more water. Eat at least 6 servings of fruit and vegetables daily    3. Major depression in remission ([place])  Continue medication   4. Centrilobular emphysema ([place])  Discussed quitting smoking again  5. Dyslipidemia  - rosuvastatin (CRESTOR) 40 MG tablet; Take 1 tablet (40 mg total) by mouth daily.  Dispense: 90 tablet; Refill: 1 - Lipid panel  6. Benign hypertension  - olmesartan-hydrochlorothiazide (BENICAR HCT) 20-12.5 MG tablet; Take 1 tablet by mouth daily.  Dispense: 90 tablet; Refill: 1 - COMPLETE METABOLIC PANEL WITH GFR  7. Coronary artery calcification  - rosuvastatin (CRESTOR) 40 MG tablet; Take 1 tablet (40 mg total) by mouth daily.  Dispense: 90 tablet; Refill: 1  8. Generalized anxiety disorder  Seen by psychiatrist and therapist, and is doing better  9. CAD in native artery    10. Trigger ring finger, unspecified laterality   She asked for oral steroids, she has been stretching, discussed steroid injections, I can give her some nsaid's to take prn

## 2022-01-26 ENCOUNTER — Ambulatory Visit: Payer: Federal, State, Local not specified - PPO | Admitting: Family Medicine

## 2022-01-26 ENCOUNTER — Encounter: Payer: Self-pay | Admitting: Family Medicine

## 2022-01-26 VITALS — BP 112/72 | HR 92 | Temp 97.7°F | Resp 16 | Ht 63.0 in | Wt 222.6 lb

## 2022-01-26 DIAGNOSIS — I2510 Atherosclerotic heart disease of native coronary artery without angina pectoris: Secondary | ICD-10-CM

## 2022-01-26 DIAGNOSIS — J432 Centrilobular emphysema: Secondary | ICD-10-CM

## 2022-01-26 DIAGNOSIS — F411 Generalized anxiety disorder: Secondary | ICD-10-CM

## 2022-01-26 DIAGNOSIS — I10 Essential (primary) hypertension: Secondary | ICD-10-CM

## 2022-01-26 DIAGNOSIS — I700 Atherosclerosis of aorta: Secondary | ICD-10-CM | POA: Diagnosis not present

## 2022-01-26 DIAGNOSIS — M65349 Trigger finger, unspecified ring finger: Secondary | ICD-10-CM

## 2022-01-26 DIAGNOSIS — E785 Hyperlipidemia, unspecified: Secondary | ICD-10-CM

## 2022-01-26 DIAGNOSIS — I2584 Coronary atherosclerosis due to calcified coronary lesion: Secondary | ICD-10-CM

## 2022-01-26 DIAGNOSIS — F325 Major depressive disorder, single episode, in full remission: Secondary | ICD-10-CM

## 2022-01-26 MED ORDER — MELOXICAM 7.5 MG PO TABS: 7.5000 mg | ORAL_TABLET | Freq: Every day | ORAL | 0 refills | Status: DC

## 2022-01-26 MED ORDER — OLMESARTAN MEDOXOMIL-HCTZ 20-12.5 MG PO TABS: 1.0000 | ORAL_TABLET | Freq: Every day | ORAL | 1 refills | Status: DC

## 2022-01-26 MED ORDER — ROSUVASTATIN CALCIUM 40 MG PO TABS: 40.0000 mg | ORAL_TABLET | Freq: Every day | ORAL | 1 refills | Status: DC

## 2022-01-29 ENCOUNTER — Encounter: Payer: Self-pay | Admitting: Family Medicine

## 2022-02-26 ENCOUNTER — Other Ambulatory Visit: Payer: Self-pay | Admitting: Family Medicine

## 2022-02-26 DIAGNOSIS — I10 Essential (primary) hypertension: Secondary | ICD-10-CM

## 2022-03-08 ENCOUNTER — Encounter: Payer: Federal, State, Local not specified - PPO | Admitting: Family Medicine

## 2022-04-30 ENCOUNTER — Ambulatory Visit: Payer: Federal, State, Local not specified - PPO | Admitting: Nurse Practitioner

## 2022-04-30 ENCOUNTER — Encounter: Payer: Self-pay | Admitting: Nurse Practitioner

## 2022-04-30 VITALS — BP 108/65 | HR 91 | Temp 98.5°F | Resp 16 | Ht 63.0 in | Wt 216.2 lb

## 2022-04-30 DIAGNOSIS — I10 Essential (primary) hypertension: Secondary | ICD-10-CM

## 2022-04-30 DIAGNOSIS — J432 Centrilobular emphysema: Secondary | ICD-10-CM | POA: Diagnosis not present

## 2022-04-30 DIAGNOSIS — I700 Atherosclerosis of aorta: Secondary | ICD-10-CM | POA: Diagnosis not present

## 2022-04-30 DIAGNOSIS — G4709 Other insomnia: Secondary | ICD-10-CM | POA: Diagnosis not present

## 2022-04-30 MED ORDER — ROSUVASTATIN CALCIUM 40 MG PO TABS: 40.0000 mg | ORAL_TABLET | ORAL | 1 refills | Status: DC

## 2022-04-30 MED ORDER — OLMESARTAN MEDOXOMIL-HCTZ 20-12.5 MG PO TABS: 1.0000 | ORAL_TABLET | Freq: Every day | ORAL | 1 refills | Status: DC

## 2022-04-30 MED ORDER — TRAZODONE HCL 100 MG PO TABS: 100.0000 mg | ORAL_TABLET | Freq: Every day | ORAL | 1 refills | Status: DC
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
## 2022-05-01 LAB — CMP14+EGFR
ALT: 27 IU/L (ref 0–32)
AST: 22 IU/L (ref 0–40)
Albumin/Globulin Ratio: 2.3 — ABNORMAL HIGH (ref 1.2–2.2)
Albumin: 4.8 g/dL (ref 3.8–4.8)
Alkaline Phosphatase: 121 IU/L (ref 44–121)
BUN/Creatinine Ratio: 39 — ABNORMAL HIGH (ref 12–28)
BUN: 22 mg/dL (ref 8–27)
Bilirubin Total: 0.3 mg/dL (ref 0.0–1.2)
CO2: 22 mmol/L (ref 20–29)
Calcium: 10.1 mg/dL (ref 8.7–10.3)
Chloride: 105 mmol/L (ref 96–106)
Creatinine, Ser: 0.57 mg/dL (ref 0.57–1.00)
Globulin, Total: 2.1 g/dL (ref 1.5–4.5)
Glucose: 95 mg/dL (ref 70–99)
Potassium: 5.1 mmol/L (ref 3.5–5.2)
Sodium: 140 mmol/L (ref 134–144)
Total Protein: 6.9 g/dL (ref 6.0–8.5)
eGFR: 96 mL/min/{1.73_m2} (ref >59)

## 2022-05-01 LAB — LIPID PANEL
Chol/HDL Ratio: 2.3 ratio (ref 0.0–4.4)
Cholesterol, Total: 150 mg/dL (ref 100–199)
HDL: 64 mg/dL (ref >39)
LDL Chol Calc (NIH): 71 mg/dL (ref 0–99)
Triglycerides: 80 mg/dL (ref 0–149)
VLDL Cholesterol Cal: 15 mg/dL (ref 5–40)

## 2022-05-01 LAB — CBC WITH DIFFERENTIAL/PLATELET
Basophils Absolute: 0.1 10*3/uL (ref 0.0–0.2)
Basos: 1 %
EOS (ABSOLUTE): 0.2 10*3/uL (ref 0.0–0.4)
Eos: 3 %
Hematocrit: 44.2 % (ref 34.0–46.6)
Hemoglobin: 14.8 g/dL (ref 11.1–15.9)
Immature Grans (Abs): 0 10*3/uL (ref 0.0–0.1)
Immature Granulocytes: 0 %
Lymphocytes Absolute: 1.9 10*3/uL (ref 0.7–3.1)
Lymphs: 25 %
MCH: 30.8 pg (ref 26.6–33.0)
MCHC: 33.5 g/dL (ref 31.5–35.7)
MCV: 92 fL (ref 79–97)
Monocytes Absolute: 0.5 10*3/uL (ref 0.1–0.9)
Monocytes: 7 %
Neutrophils Absolute: 5 10*3/uL (ref 1.4–7.0)
Neutrophils: 64 %
Platelets: 314 10*3/uL (ref 150–450)
RBC: 4.8 x10E6/uL (ref 3.77–5.28)
RDW: 12 % (ref 11.7–15.4)
WBC: 7.8 10*3/uL (ref 3.4–10.8)

## 2022-05-10 NOTE — Progress Notes (Signed)
No critical abnormal values, will discuss results at her next office visit

## 2022-06-01 ENCOUNTER — Ambulatory Visit: Payer: Federal, State, Local not specified - PPO

## 2022-06-02 ENCOUNTER — Ambulatory Visit
Admission: RE | Admit: 2022-06-02 | Discharge: 2022-06-02 | Disposition: A | Discharge status: Home / Self Care | Payer: Federal, State, Local not specified - PPO | Source: Ambulatory Visit | Attending: Acute Care | Admitting: Acute Care

## 2022-06-02 DIAGNOSIS — R911 Solitary pulmonary nodule: Secondary | ICD-10-CM | POA: Diagnosis present

## 2022-06-02 DIAGNOSIS — Z87891 Personal history of nicotine dependence: Secondary | ICD-10-CM | POA: Insufficient documentation

## 2022-06-07 ENCOUNTER — Other Ambulatory Visit: Payer: Self-pay | Admitting: Acute Care

## 2022-06-07 ENCOUNTER — Encounter: Payer: Federal, State, Local not specified - PPO | Admitting: Family Medicine

## 2022-06-07 DIAGNOSIS — Z122 Encounter for screening for malignant neoplasm of respiratory organs: Secondary | ICD-10-CM

## 2022-06-07 DIAGNOSIS — Z87891 Personal history of nicotine dependence: Secondary | ICD-10-CM

## 2022-06-07 DIAGNOSIS — F17210 Nicotine dependence, cigarettes, uncomplicated: Secondary | ICD-10-CM

## 2022-06-09 ENCOUNTER — Ambulatory Visit (INDEPENDENT_AMBULATORY_CARE_PROVIDER_SITE_OTHER): Payer: Federal, State, Local not specified - PPO | Admitting: Nurse Practitioner

## 2022-06-09 ENCOUNTER — Encounter: Payer: Self-pay | Admitting: Nurse Practitioner

## 2022-06-09 VITALS — BP 130/78 | HR 85 | Temp 98.5°F | Resp 16 | Ht 63.0 in | Wt 214.6 lb

## 2022-06-09 DIAGNOSIS — I10 Essential (primary) hypertension: Secondary | ICD-10-CM

## 2022-06-09 DIAGNOSIS — Z0001 Encounter for general adult medical examination with abnormal findings: Secondary | ICD-10-CM | POA: Diagnosis not present

## 2022-06-09 DIAGNOSIS — I700 Atherosclerosis of aorta: Secondary | ICD-10-CM

## 2022-06-09 DIAGNOSIS — J432 Centrilobular emphysema: Secondary | ICD-10-CM

## 2022-06-09 DIAGNOSIS — G4709 Other insomnia: Secondary | ICD-10-CM | POA: Diagnosis not present

## 2022-06-09 NOTE — Progress Notes (Signed)
[facility] [address] [place], [place] [address]  Internal MEDICINE  Office Visit Note  Patient Name: Veronica Mendez  [id number]  [id number]  Date of Service: 06/09/2022  Chief Complaint  Patient presents with  . Hypertension  . Hyperlipidemia  . Depression  . Annual Exam    HPI Veronica Mendez presents for an annual well visit and physical exam.  Well-appearing 72 y.o. female with Routine CRC screening: colonoscopy done 2022, due in 2025.  Mammogram due in July  Labs: labs are normal, no issues, discussed with patient.  New or worsening pain:  none Lung cancer screening -- lung rads 2 benign Quit taking BP pill about 18 days ago, was feeling woozy, improved, BP has been ok, checking it at  home.   Functional Status Survey: Is the patient deaf or have difficulty hearing?: No Does the patient have difficulty seeing, even when wearing glasses/contacts?: No Does the patient have difficulty concentrating, remembering, or making decisions?: No Does the patient have difficulty walking or climbing stairs?: No Does the patient have difficulty dressing or bathing?: No Does the patient have difficulty doing errands alone such as visiting a doctor's office or shopping?: No     03/06/2021   10:49 AM 08/19/2021    2:16 PM 01/26/2022   11:32 AM 04/30/2022   10:47 AM 06/09/2022   10:54 AM  Fall Risk  Falls in the past year? 0 0 0 0 0  Was there an injury with Fall? 0   0 0  Fall Risk Category Calculator 0   0 0  Fall Risk Category (Retired) Low      (RETIRED) Patient Fall Risk Level Low fall risk Low fall risk Low fall risk    Patient at Risk for Falls Due to No Fall Risks No Fall Risks No Fall Risks No Fall Risks No Fall Risks  Fall risk Follow up Falls prevention discussed Falls prevention discussed Falls prevention discussed;Education provided;Falls evaluation completed Falls evaluation completed Falls evaluation completed       06/09/2022   10:54 AM  Depression screen  PHQ 2/9  Decreased Interest 0  Down, Depressed, Hopeless 0  PHQ - 2 Score 0       08/19/2021    2:16 PM 03/06/2021   11:04 AM 01/06/2021   12:14 PM 10/06/2020   11:12 AM  GAD 7 : Generalized Anxiety Score  Nervous, Anxious, on Edge 0 3 3 1   Control/stop worrying 0 2 3 0  Worry too much - different things 0 2 3 0  Trouble relaxing 0 3 3 1   Restless 0 3 3 0  Easily annoyed or irritable 0 1 0 0  Afraid - awful might happen 0 2 3 1   Total GAD 7 Score 0 16 18 3   Anxiety Difficulty  Somewhat difficult  Not difficult at all      Current Medication: Outpatient Encounter Medications as of 06/09/2022  Medication Sig  . olmesartan-hydrochlorothiazide (BENICAR HCT) 20-12.5 MG tablet Take 1 tablet by mouth daily.  . traZODone (DESYREL) 100 MG tablet Take 1 tablet (100 mg total) by mouth at bedtime.  . Vitamin D, Cholecalciferol, 1000 units TABS Take 1 tablet by mouth daily.  . [DISCONTINUED] rosuvastatin (CRESTOR) 40 MG tablet Take 1 tablet (40 mg total) by mouth every Monday, Wednesday, and Friday.   No facility-administered encounter medications on file as of 06/09/2022.    Surgical History: Past Surgical History:  Procedure Laterality Date  . ABDOMINAL HYSTERECTOMY Bilateral 1988  .  COLONOSCOPY N/A 09/15/2020   Procedure: COLONOSCOPY;  Surgeon: Wyline Mood, MD;  Location[facility] ENDOSCOPY;  Service: Gastroenterology;  Laterality: N/A;  . KNEE SURGERY Left 2014    Medical History: Past Medical History:  Diagnosis Date  . Depression   . History of kidney stones   . Hyperlipidemia   . Hypertension   . Obesity, Class III, BMI 40-49.9 (morbid obesity) 11/10/2015    Family History: Family History  Problem Relation Age of Onset  . Osteoarthritis Mother   . Stroke Mother   . Heart disease Father   . Peripheral Artery Disease Sister   . Diabetes Brother   . Heart attack Brother   . Diabetes Maternal Grandfather   . Heart attack Paternal Uncle   . Heart attack Paternal Uncle   .  Heart attack Paternal Uncle   . Heart attack Paternal Uncle   . Heart attack Paternal Uncle   . Heart attack Paternal Uncle   . Heart attack Paternal Uncle   . Heart attack Brother   . Atrial fibrillation Brother   . Heart Problems Brother   . Heart attack Brother   . Stroke Brother   . Heart disease Brother   . Heart disease Other     Social History   Socioeconomic History  . Marital status: Married    Spouse name: Dannielle Huh  . Number of children: 1  . Years of education: Not on file  . Highest education level: Some college, no degree  Occupational History  . Occupation: Retired  Tobacco Use  . Smoking status: Every Day    Packs/day: 1.00    Years: 50.00    Additional pack years: 0.00    Total pack years: 50.00    Types: Cigarettes    Start date: 05/26/2016  . Smokeless tobacco: Never  . Tobacco comments:    down to 5 cigarettes daily   Vaping Use  . Vaping Use: Never used  Substance and Sexual Activity  . Alcohol use: Yes    Comment: rarely  . Drug use: No  . Sexual activity: Yes    Partners: Male  Other Topics Concern  . Not on file  Social History Narrative  . Not on file   Social Determinants of Health   Financial Resource Strain: Low Risk  (03/06/2021)   Overall Financial Resource Strain (CARDIA)   . Difficulty of Paying Living Expenses: Not hard at all  Food Insecurity: No Food Insecurity (03/06/2021)   Hunger Vital Sign   . Worried About Programme researcher, broadcasting/film/video in the Last Year: Never true   . Ran Out of Food in the Last Year: Never true  Transportation Needs: No Transportation Needs (03/06/2021)   PRAPARE - Transportation   . Lack of Transportation (Medical): No   . Lack of Transportation (Non-Medical): No  Physical Activity: Sufficiently Active (03/06/2021)   Exercise Vital Sign   . Days of Exercise per Week: 5 days   . Minutes of Exercise per Session: 30 min  Stress: Stress Concern Present (03/06/2021)   [employer] of Occupational Health -  Occupational Stress Questionnaire   . Feeling of Stress : Very much  Social Connections: Moderately Integrated (03/06/2021)   Social Connection and Isolation Panel [NHANES]   . Frequency of Communication with Friends and Family: Once a week   . Frequency of Social Gatherings with Friends and Family: Once a week   . Attends Religious Services: 1 to 4 times per year   . Active Member of  Clubs or Organizations: Yes   . Attends Banker Meetings: 1 to 4 times per year   . Marital Status: Married  Catering manager Violence: Not At Risk (03/06/2021)   Humiliation, Afraid, Rape, and Kick questionnaire   . Fear of Current or Ex-Partner: No   . Emotionally Abused: No   . Physically Abused: No   . Sexually Abused: No      Review of Systems  Constitutional:  Negative for activity change, appetite change, chills, fatigue, fever and unexpected weight change.  HENT: Negative.  Negative for congestion, ear pain, rhinorrhea, sore throat and trouble swallowing.   Eyes: Negative.   Respiratory: Negative.  Negative for cough, chest tightness, shortness of breath and wheezing.   Cardiovascular: Negative.  Negative for chest pain.  Gastrointestinal: Negative.  Negative for abdominal pain, blood in stool, constipation, diarrhea, nausea and vomiting.  Endocrine: Negative.   Genitourinary: Negative.  Negative for difficulty urinating, dysuria, frequency, hematuria and urgency.  Musculoskeletal: Negative.  Negative for arthralgias, back pain, joint swelling, myalgias and neck pain.  Skin: Negative.  Negative for rash and wound.  Allergic/Immunologic: Negative.  Negative for immunocompromised state.  Neurological: Negative.  Negative for dizziness, seizures, numbness and headaches.  Hematological: Negative.   Psychiatric/Behavioral:  Positive for depression. Negative for behavioral problems, self-injury and suicidal ideas. The patient is not nervous/anxious.     Vital Signs: BP 130/78   Pulse  85   Temp 98.5 F (36.9 C)   Resp 16   Ht 5\' 3"  (1.6 m)   Wt 214 lb 9.6 oz (97.3 kg)   SpO2 97%   BMI 38.01 kg/m    Physical Exam Vitals reviewed.  Constitutional:      General: She is not in acute distress.    Appearance: Normal appearance. She is well-developed and well-groomed. She is obese. She is not ill-appearing or diaphoretic.  HENT:     Head: Normocephalic and atraumatic.     Right Ear: Tympanic membrane, ear canal and external ear normal. There is no impacted cerumen.     Left Ear: Tympanic membrane, ear canal and external ear normal. There is no impacted cerumen.     Nose: Nose normal. No congestion or rhinorrhea.     Mouth/Throat:     Mouth: Mucous membranes are moist.     Pharynx: Oropharynx is clear. No oropharyngeal exudate or posterior oropharyngeal erythema.  Eyes:     General: No scleral icterus.       Right eye: No discharge.        Left eye: No discharge.     Extraocular Movements: Extraocular movements intact.     Conjunctiva/sclera: Conjunctivae normal.     Pupils: Pupils are equal, round, and reactive to light.  Neck:     Thyroid: No thyromegaly.     Vascular: No JVD.     Trachea: No tracheal deviation.  Cardiovascular:     Rate and Rhythm: Normal rate and regular rhythm.     Heart sounds: Normal heart sounds. No murmur heard.    No friction rub. No gallop.  Pulmonary:     Effort: Pulmonary effort is normal. No respiratory distress.     Breath sounds: Normal breath sounds. No stridor. No wheezing or rales.  Chest:     Chest wall: No tenderness.  Abdominal:     General: Bowel sounds are normal. There is no distension.     Palpations: Abdomen is soft. There is no mass.     Tenderness:  There is no abdominal tenderness. There is no guarding or rebound.  Musculoskeletal:        General: No tenderness or deformity. Normal range of motion.     Cervical back: Normal range of motion and neck supple.  Lymphadenopathy:     Cervical: No cervical  adenopathy.  Skin:    General: Skin is warm and dry.     Capillary Refill: Capillary refill takes less than 2 seconds.     Coloration: Skin is not pale.     Findings: No erythema or rash.  Neurological:     Mental Status: She is alert and oriented to person, place, and time.     Cranial Nerves: No cranial nerve deficit.     Motor: No abnormal muscle tone.     Coordination: Coordination normal.     Deep Tendon Reflexes: Reflexes are normal and symmetric.  Psychiatric:        Mood and Affect: Mood normal.        Behavior: Behavior normal. Behavior is cooperative.        Thought Content: Thought content normal.        Judgment: Judgment normal.       Assessment/Plan:     General Counseling: Mardelle verbalizes understanding of the findings of todays visit and agrees with plan of treatment. I have discussed any further diagnostic evaluation that may be needed or ordered today. We also reviewed her medications today. she has been encouraged to call the office with any questions or concerns that should arise related to todays visit.    No orders of the defined types were placed in this encounter.   No orders of the defined types were placed in this encounter.   Return in about 6 months (around 12/09/2022) for F/U, Alston Berrie PCP.   Total time spent:30 Minutes Time spent includes review of chart, medications, test results, and follow up plan with the patient.   [place] Controlled Substance Database was reviewed by me.  This patient was seen by Sallyanne Kuster, FNP-C in collaboration with Dr. Beverely Risen as a part of collaborative care agreement.  Halla Chopp R. Tedd Sias, MSN, FNP-C Internal medicine

## 2022-06-10 ENCOUNTER — Encounter: Payer: Self-pay | Admitting: Nurse Practitioner

## 2022-10-06 ENCOUNTER — Telehealth: Payer: Self-pay

## 2022-10-06 NOTE — Telephone Encounter (Signed)
Copied from CRM [id number]. Topic: General - Inquiry >> Oct 05, 2022  1:33 PM [name] wrote: Reason for CRM: [name] called as she was inquiring about becoming a patient of [facility] under Dr Suzie Portela for a weight loss program she does. She has questions and request someone call her back

## 2022-12-01 ENCOUNTER — Other Ambulatory Visit: Payer: Self-pay | Admitting: Nurse Practitioner

## 2022-12-01 DIAGNOSIS — G4709 Other insomnia: Secondary | ICD-10-CM

## 2022-12-09 ENCOUNTER — Ambulatory Visit: Payer: Federal, State, Local not specified - PPO | Admitting: Nurse Practitioner

## 2022-12-09 ENCOUNTER — Encounter: Payer: Self-pay | Admitting: Nurse Practitioner

## 2022-12-09 VITALS — BP 120/82 | HR 83 | Temp 98.2°F | Resp 16 | Ht 63.0 in | Wt 212.6 lb

## 2022-12-09 DIAGNOSIS — F331 Major depressive disorder, recurrent, moderate: Secondary | ICD-10-CM | POA: Diagnosis not present

## 2022-12-09 DIAGNOSIS — F411 Generalized anxiety disorder: Secondary | ICD-10-CM

## 2022-12-09 DIAGNOSIS — I10 Essential (primary) hypertension: Secondary | ICD-10-CM | POA: Diagnosis not present

## 2022-12-09 MED ORDER — SERTRALINE HCL 50 MG PO TABS: 50.0000 mg | ORAL_TABLET | Freq: Every day | ORAL | 3 refills | Status: DC

## 2022-12-09 MED ORDER — OLMESARTAN MEDOXOMIL-HCTZ 20-12.5 MG PO TABS: 1.0000 | ORAL_TABLET | Freq: Every day | ORAL | 1 refills | Status: DC

## 2022-12-09 NOTE — Progress Notes (Signed)
[facility] [address] [place], [place] [address]  Internal MEDICINE  Office Visit Note  Patient Name: Veronica Mendez  [id number]  [id number]  Date of Service: 12/09/2022  Chief Complaint  Patient presents with   Depression   Hypertension   Hyperlipidemia   Follow-up    HPI Natosha presents for a follow-up visit for depression/anxiety, weight loss Recent flu vaccine, and plans to get covid vaccine  Tried escitalopram, buspirone, celexa, pristiq, venlafaxine Taking semaglutide for weight loss Weight loss clinic in [place] -- down 13 lbs since August  .    12/09/2022    9:49 AM 08/19/2021    2:16 PM 03/06/2021   11:04 AM 01/06/2021   12:14 PM  GAD 7 : Generalized Anxiety Score  Nervous, Anxious, on Edge 1 0 3 3  Control/stop worrying 2 0 2 3  Worry too much - different things 1 0 2 3  Trouble relaxing 1 0 3 3  Restless 0 0 3 3  Easily annoyed or irritable 3 0 1 0  Afraid - awful might happen 3 0 2 3  Total GAD 7 Score 11 0 16 18  Anxiety Difficulty Somewhat difficult  Somewhat difficult       Current Medication: Outpatient Encounter Medications as of 12/09/2022  Medication Sig   traZODone (DESYREL) 100 MG tablet TAKE 1 TABLET BY MOUTH EVERYDAY AT BEDTIME   Vitamin D, Cholecalciferol, 1000 units TABS Take 1 tablet by mouth daily.   [DISCONTINUED] olmesartan-hydrochlorothiazide (BENICAR HCT) 20-12.5 MG tablet Take 1 tablet by mouth daily.   [DISCONTINUED] sertraline (ZOLOFT) 50 MG tablet Take 1 tablet (50 mg total) by mouth daily.   olmesartan-hydrochlorothiazide (BENICAR HCT) 20-12.5 MG tablet Take 1 tablet by mouth daily.   No facility-administered encounter medications on file as of 12/09/2022.    Surgical History: Past Surgical History:  Procedure Laterality Date   ABDOMINAL HYSTERECTOMY Bilateral 1988   COLONOSCOPY N/A 09/15/2020   Procedure: COLONOSCOPY;  Surgeon: Wyline Mood, MD;  Location: [facility] ENDOSCOPY;  Service: Gastroenterology;   Laterality: N/A;   KNEE SURGERY Left 2014    Medical History: Past Medical History:  Diagnosis Date   Depression    History of kidney stones    Hyperlipidemia    Hypertension    Obesity, Class III, BMI 40-49.9 (morbid obesity) (HCC) 11/10/2015    Family History: Family History  Problem Relation Age of Onset   Osteoarthritis Mother    Stroke Mother    Heart disease Father    Peripheral Artery Disease Sister    Diabetes Brother    Heart attack Brother    Diabetes Maternal Grandfather    Heart attack Paternal Uncle    Heart attack Paternal Uncle    Heart attack Paternal Uncle    Heart attack Paternal Uncle    Heart attack Paternal Uncle    Heart attack Paternal Uncle    Heart attack Paternal Uncle    Heart attack Brother    Atrial fibrillation Brother    Heart Problems Brother    Heart attack Brother    Stroke Brother    Heart disease Brother    Heart disease Other     Social History   Socioeconomic History   Marital status: Married    Spouse name: Dannielle Huh   Number of children: 1   Years of education: Not on file   Highest education level: Some college, no degree  Occupational History   Occupation: Retired  Tobacco Use   Smoking status:  Every Day    Current packs/day: 1.00    Average packs/day: 1 pack/day for 50.2 years (50.2 ttl pk-yrs)    Types: Cigarettes    Start date: 05/26/2016   Smokeless tobacco: Never   Tobacco comments:    down to 5 cigarettes daily   Vaping Use   Vaping status: Never Used  Substance and Sexual Activity   Alcohol use: Not Currently    Comment: rarely   Drug use: No   Sexual activity: Yes    Partners: Male  Other Topics Concern   Not on file  Social History Narrative   Not on file   Social Drivers of Health   Financial Resource Strain: Low Risk  (03/06/2021)   Overall Financial Resource Strain (CARDIA)    Difficulty of Paying Living Expenses: Not hard at all  Food Insecurity: No Food Insecurity (03/06/2021)   Hunger Vital  Sign    Worried About Running Out of Food in the Last Year: Never true    Ran Out of Food in the Last Year: Never true  Transportation Needs: No Transportation Needs (03/06/2021)   PRAPARE - Administrator, Civil Service (Medical): No    Lack of Transportation (Non-Medical): No  Physical Activity: Sufficiently Active (03/06/2021)   Exercise Vital Sign    Days of Exercise per Week: 5 days    Minutes of Exercise per Session: 30 min  Stress: Stress Concern Present (03/06/2021)   [employer] of Occupational Health - Occupational Stress Questionnaire    Feeling of Stress : Very much  Social Connections: Moderately Integrated (03/06/2021)   Social Connection and Isolation Panel [NHANES]    Frequency of Communication with Friends and Family: Once a week    Frequency of Social Gatherings with Friends and Family: Once a week    Attends Religious Services: 1 to 4 times per year    Active Member of [employer] or Organizations: Yes    Attends Banker Meetings: 1 to 4 times per year    Marital Status: Married  Catering manager Violence: Not At Risk (03/06/2021)   Humiliation, Afraid, Rape, and Kick questionnaire    Fear of Current or Ex-Partner: No    Emotionally Abused: No    Physically Abused: No    Sexually Abused: No      Review of Systems  Constitutional: Negative.  Negative for fatigue.  Respiratory: Negative.  Negative for cough, chest tightness, shortness of breath and wheezing.   Cardiovascular: Negative.  Negative for chest pain and palpitations.  Gastrointestinal: Negative.   Genitourinary: Negative.   Neurological: Negative.   Psychiatric/Behavioral:  Positive for behavioral problems and sleep disturbance. Negative for self-injury and suicidal ideas. The patient is nervous/anxious.     Vital Signs: BP 120/82   Pulse 83   Temp 98.2 F (36.8 C)   Resp 16   Ht 5\' 3"  (1.6 m)   Wt 212 lb 9.6 oz (96.4 kg)   SpO2 96%   BMI 37.66 kg/m    Physical  Exam Vitals reviewed.  Constitutional:      General: She is not in acute distress.    Appearance: Normal appearance. She is obese. She is not ill-appearing.  HENT:     Head: Normocephalic and atraumatic.  Eyes:     Pupils: Pupils are equal, round, and reactive to light.  Cardiovascular:     Rate and Rhythm: Normal rate and regular rhythm.  Pulmonary:     Effort: Pulmonary effort is normal. No  respiratory distress.  Neurological:     Mental Status: She is alert and oriented to person, place, and time.  Psychiatric:        Mood and Affect: Mood is anxious and depressed. Affect is tearful.        Behavior: Behavior normal. Behavior is cooperative.        Assessment/Plan: 1. Benign hypertension (Primary) Continue olmesartan-hydrochlorothiazide as prescribed.  - olmesartan-hydrochlorothiazide (BENICAR HCT) 20-12.5 MG tablet; Take 1 tablet by mouth daily.  Dispense: 90 tablet; Refill: 1  2. Generalized anxiety disorder Will try sertraline 50 mg daily, follow up in 4 weeks   3. Moderate episode of recurrent major depressive disorder (HCC) Try sertraline as prescribed. Follow up in 4 weeks    General Counseling: Joselle verbalizes understanding of the findings of todays visit and agrees with plan of treatment. I have discussed any further diagnostic evaluation that may be needed or ordered today. We also reviewed her medications today. she has been encouraged to call the office with any questions or concerns that should arise related to todays visit.    No orders of the defined types were placed in this encounter.   Meds ordered this encounter  Medications   olmesartan-hydrochlorothiazide (BENICAR HCT) 20-12.5 MG tablet    Sig: Take 1 tablet by mouth daily.    Dispense:  90 tablet    Refill:  1    For next fill   DISCONTD: sertraline (ZOLOFT) 50 MG tablet    Sig: Take 1 tablet (50 mg total) by mouth daily.    Dispense:  30 tablet    Refill:  3    Return in about 1  month (around 01/09/2023) for F/U, Avyukth Bontempo PCP, eval new med.   Total time spent:30 Minutes Time spent includes review of chart, medications, test results, and follow up plan with the patient.   [place] Controlled Substance Database was reviewed by me.  This patient was seen by Sallyanne Kuster, FNP-C in collaboration with Dr. Beverely Risen as a part of collaborative care agreement.   Makalah Asberry R. Tedd Sias, MSN, FNP-C Internal medicine

## 2023-01-02 ENCOUNTER — Other Ambulatory Visit: Payer: Self-pay | Admitting: Nurse Practitioner

## 2023-01-12 ENCOUNTER — Ambulatory Visit: Payer: Federal, State, Local not specified - PPO | Admitting: Nurse Practitioner

## 2023-01-17 ENCOUNTER — Ambulatory Visit: Payer: Federal, State, Local not specified - PPO | Admitting: Nurse Practitioner

## 2023-01-17 ENCOUNTER — Encounter: Payer: Self-pay | Admitting: Nurse Practitioner

## 2023-01-17 VITALS — BP 125/69 | HR 83 | Temp 98.4°F | Resp 16 | Ht 63.0 in | Wt 204.0 lb

## 2023-01-17 DIAGNOSIS — F411 Generalized anxiety disorder: Secondary | ICD-10-CM

## 2023-01-17 DIAGNOSIS — I10 Essential (primary) hypertension: Secondary | ICD-10-CM | POA: Diagnosis not present

## 2023-01-17 DIAGNOSIS — I700 Atherosclerosis of aorta: Secondary | ICD-10-CM

## 2023-01-17 NOTE — Progress Notes (Signed)
[facility] [address] [place], [place] [address]  Internal MEDICINE  Office Visit Note  Patient Name: Veronica Mendez  [id number]  [id number]  Date of Service: 01/17/2023  Chief Complaint  Patient presents with   Depression   Hypertension   Hyperlipidemia   Follow-up    Eval new med.     HPI Veronica Mendez presents for a follow-up visit for anxiety, aortic atherosclerosis and hypertension.  Anxiety -- started on sertraline 50 mg daily. Doing well. Anxiety level has improved.  Aortic atherosclerosis -- has upcoming appointment with cardiology in January. Hypertension -- controlled with current medication     Current Medication: Outpatient Encounter Medications as of 01/17/2023  Medication Sig   olmesartan-hydrochlorothiazide (BENICAR HCT) 20-12.5 MG tablet Take 1 tablet by mouth daily.   sertraline (ZOLOFT) 50 MG tablet TAKE 1 TABLET BY MOUTH EVERY DAY   traZODone (DESYREL) 100 MG tablet TAKE 1 TABLET BY MOUTH EVERYDAY AT BEDTIME   Vitamin D, Cholecalciferol, 1000 units TABS Take 1 tablet by mouth daily.   No facility-administered encounter medications on file as of 01/17/2023.    Surgical History: Past Surgical History:  Procedure Laterality Date   ABDOMINAL HYSTERECTOMY Bilateral 1988   COLONOSCOPY N/A 09/15/2020   Procedure: COLONOSCOPY;  Surgeon: Wyline Mood, MD;  Location: [facility] ENDOSCOPY;  Service: Gastroenterology;  Laterality: N/A;   KNEE SURGERY Left 2014    Medical History: Past Medical History:  Diagnosis Date   Depression    History of kidney stones    Hyperlipidemia    Hypertension    Obesity, Class III, BMI 40-49.9 (morbid obesity) (HCC) 11/10/2015    Family History: Family History  Problem Relation Age of Onset   Osteoarthritis Mother    Stroke Mother    Heart disease Father    Peripheral Artery Disease Sister    Diabetes Brother    Heart attack Brother    Diabetes Maternal Grandfather    Heart attack Paternal Uncle    Heart attack  Paternal Uncle    Heart attack Paternal Uncle    Heart attack Paternal Uncle    Heart attack Paternal Uncle    Heart attack Paternal Uncle    Heart attack Paternal Uncle    Heart attack Brother    Atrial fibrillation Brother    Heart Problems Brother    Heart attack Brother    Stroke Brother    Heart disease Brother    Heart disease Other     Social History   Socioeconomic History   Marital status: Married    Spouse name: Archivist   Number of children: 1   Years of education: Not on file   Highest education level: Some college, no degree  Occupational History   Occupation: Retired  Tobacco Use   Smoking status: Every Day    Current packs/day: 1.00    Average packs/day: 1 pack/day for 50.1 years (50.1 ttl pk-yrs)    Types: Cigarettes    Start date: 05/26/2016   Smokeless tobacco: Never   Tobacco comments:    down to 5 cigarettes daily   Vaping Use   Vaping status: Never Used  Substance and Sexual Activity   Alcohol use: Not Currently    Comment: rarely   Drug use: No   Sexual activity: Yes    Partners: Male  Other Topics Concern   Not on file  Social History Narrative   Not on file   Social Determinants of Health   Financial Resource Strain: Low Risk  (  03/06/2021)   Overall Financial Resource Strain (CARDIA)    Difficulty of Paying Living Expenses: Not hard at all  Food Insecurity: No Food Insecurity (03/06/2021)   Hunger Vital Sign    Worried About Running Out of Food in the Last Year: Never true    Ran Out of Food in the Last Year: Never true  Transportation Needs: No Transportation Needs (03/06/2021)   PRAPARE - Administrator, Civil Service (Medical): No    Lack of Transportation (Non-Medical): No  Physical Activity: Sufficiently Active (03/06/2021)   Exercise Vital Sign    Days of Exercise per Week: 5 days    Minutes of Exercise per Session: 30 min  Stress: Stress Concern Present (03/06/2021)   [employer] of Occupational Health -  Occupational Stress Questionnaire    Feeling of Stress : Very much  Social Connections: Moderately Integrated (03/06/2021)   Social Connection and Isolation Panel [NHANES]    Frequency of Communication with Friends and Family: Once a week    Frequency of Social Gatherings with Friends and Family: Once a week    Attends Religious Services: 1 to 4 times per year    Active Member of [employer] or Organizations: Yes    Attends Banker Meetings: 1 to 4 times per year    Marital Status: Married  Catering manager Violence: Not At Risk (03/06/2021)   Humiliation, Afraid, Rape, and Kick questionnaire    Fear of Current or Ex-Partner: No    Emotionally Abused: No    Physically Abused: No    Sexually Abused: No      Review of Systems  Constitutional:  Negative for chills, fatigue and unexpected weight change.  HENT:  Negative for congestion, rhinorrhea, sneezing and sore throat.   Eyes:  Negative for redness.  Respiratory:  Negative for cough, chest tightness and shortness of breath.   Cardiovascular:  Negative for chest pain and palpitations.  Gastrointestinal:  Negative for abdominal pain, constipation, diarrhea, nausea and vomiting.  Genitourinary:  Negative for dysuria and frequency.  Musculoskeletal:  Negative for arthralgias, back pain, joint swelling and neck pain.  Skin:  Negative for rash.  Neurological: Negative.  Negative for tremors and numbness.  Hematological:  Negative for adenopathy. Does not bruise/bleed easily.  Psychiatric/Behavioral:  Negative for behavioral problems (Depression), sleep disturbance and suicidal ideas. The patient is not nervous/anxious.     Vital Signs: BP 125/69   Pulse 83   Temp 98.4 F (36.9 C)   Resp 16   Ht 5\' 3"  (1.6 m)   Wt 204 lb (92.5 kg)   SpO2 98%   BMI 36.14 kg/m    Physical Exam Vitals reviewed.  Constitutional:      General: She is not in acute distress.    Appearance: Normal appearance. She is obese. She is not  ill-appearing.  HENT:     Head: Normocephalic and atraumatic.  Eyes:     Pupils: Pupils are equal, round, and reactive to light.  Cardiovascular:     Rate and Rhythm: Normal rate and regular rhythm.  Pulmonary:     Effort: Pulmonary effort is normal. No respiratory distress.  Neurological:     Mental Status: She is alert and oriented to person, place, and time.  Psychiatric:        Mood and Affect: Mood normal.        Behavior: Behavior normal.        Assessment/Plan: 1. Benign hypertension Continue olmesartan-hydrochlorothiazide as prescribed.  2. Atherosclerosis of aorta [facility]) Will see cardiology in January   3. Generalized anxiety disorder Continue sertraline as prescribed.    General Counseling: Dannell verbalizes understanding of the findings of todays visit and agrees with plan of treatment. I have discussed any further diagnostic evaluation that may be needed or ordered today. We also reviewed her medications today. she has been encouraged to call the office with any questions or concerns that should arise related to todays visit.    No orders of the defined types were placed in this encounter.   No orders of the defined types were placed in this encounter.   Return for previously scheduled, CPE, Abryana Lykens PCP in april.   Total time spent:30 Minutes Time spent includes review of chart, medications, test results, and follow up plan with the patient.   [place] Controlled Substance Database was reviewed by me.  This patient was seen by Sallyanne Kuster, FNP-C in collaboration with Dr. Beverely Risen as a part of collaborative care agreement.   Nohlan Burdin R. Tedd Sias, MSN, FNP-C Internal medicine

## 2023-01-28 ENCOUNTER — Encounter: Payer: Self-pay | Admitting: Nurse Practitioner

## 2023-02-20 NOTE — Progress Notes (Signed)
 Cardiology Office Note  Date:  02/22/2023   ID:  OMNIA DOLLINGER, DOB [birth year], MRN [id number]  PCP:  Liana Fish, NP   Chief Complaint  Patient presents with   Follow-up    Patient c/o cough/congestion that started last Thursday, has shortness of breath with feeling anxiety with having to care for her spouse that has parkinson's.      HPI:  Ms. Crounse is a 73 year old woman with past medical history of   Binge Eating Disorder   Obesity   Nicotine Dependence, quit 05/2016, age 73   Hypertension   Hyperlipidemia, treated Depression/anxiety  Still smoking Who presents for f/u of her aortic and coronary atherosclerosis, chest pain  LOV 5/22 Stress at home, helping to take care of husband who has Parkinson's Dealing with anxiety Off crestor , reports having no side effects  CT scan April 2024  images pulled up and reviewed showing mild diffuse aortic atherosclerosis, coronary calcification  Losing weight on ozempic, weight down 26 pounds 1.5 pound weight loss a week  Still smoking, would like to quit Interested in Chantix  Felt the semaglutide also is helping with her smoking cessation  No recent ER evaluation  EKG personally reviewed by myself on todays visit EKG Interpretation Date/Time:  Tuesday February 22 2023 10:12:28 EST Ventricular Rate:  89 PR Interval:  112 QRS Duration:  66 QT Interval:  358 QTC Calculation: 435 R Axis:   39  Text Interpretation: Normal sinus rhythm Normal ECG When compared with ECG of 23-Jun-2020 20:56, No significant change was found Confirmed by Perla Lye [phone]) on 02/22/2023 10:26:58 AM   Of the past medical history reviewed In the ER 06/23/2020: Dizziness,difficulty concentrating, passed out Anxiety attacks    PMH:   has a past medical history of Depression, History of kidney stones, Hyperlipidemia, Hypertension, and Obesity, Class III, BMI 40-49.9 (morbid obesity) (HCC) (11/10/2015).  PSH:    Past Surgical History:   Procedure Laterality Date   ABDOMINAL HYSTERECTOMY Bilateral 1988   COLONOSCOPY N/A 09/15/2020   Procedure: COLONOSCOPY;  Surgeon: Therisa Bi, MD;  Location: [facility] ENDOSCOPY;  Service: Gastroenterology;  Laterality: N/A;   KNEE SURGERY Left 2014    Current Outpatient Medications  Medication Sig Dispense Refill   aspirin  EC (ASPIRIN  81) 81 MG tablet Take 81 mg by mouth once a week. Swallow whole.     magnesium oxide (MAG-OX) 400 (240 Mg) MG tablet Take 400 mg by mouth 3 (three) times a week.     olmesartan -hydrochlorothiazide  (BENICAR  HCT) 20-12.5 MG tablet Take 1 tablet by mouth daily.     Phenyleph-Doxylamine-DM-APAP (ALKA-SELTZER PLS ALLERGY & CGH PO) Take by mouth.     pseudoephedrine (SUDAFED) 120 MG 12 hr tablet Take 120 mg by mouth 2 (two) times daily.     rosuvastatin  (CRESTOR ) 10 MG tablet Take 1 tablet (10 mg total) by mouth daily. 90 tablet 3   sertraline  (ZOLOFT ) 50 MG tablet TAKE 1 TABLET BY MOUTH EVERY DAY 90 tablet 2   traZODone  (DESYREL ) 100 MG tablet TAKE 1 TABLET BY MOUTH EVERYDAY AT BEDTIME 90 tablet 1   varenicline  (CHANTIX  CONTINUING MONTH PAK) 1 MG tablet Take 1 tablet (1 mg total) by mouth 2 (two) times daily. 60 tablet 4   vitamin B-12 (CYANOCOBALAMIN) 500 MCG tablet Take 500 mcg by mouth 3 (three) times a week.     Vitamin D , Cholecalciferol, 1000 units TABS Take 1 tablet by mouth daily.     No current facility-administered medications for this visit.  Allergies:   Patient has no known allergies.   Social History:  The patient  reports that she has been smoking cigarettes. She started smoking about 6 years ago. She has a 50.2 pack-year smoking history. She has never used smokeless tobacco. She reports that she does not currently use alcohol. She reports that she does not use drugs.   Family History:   family history includes Atrial fibrillation in her brother; Diabetes in her brother and maternal grandfather; Heart Problems in her brother; Heart attack in her  brother, brother, brother, paternal uncle, paternal uncle, paternal uncle, paternal uncle, paternal uncle, paternal uncle, and paternal uncle; Heart disease in her brother, father, and another family member; Osteoarthritis in her mother; Peripheral Artery Disease in her sister; Stroke in her brother and mother.    Review of Systems: Review of Systems  Constitutional: Negative.   HENT: Negative.    Respiratory: Negative.    Cardiovascular: Negative.   Gastrointestinal: Negative.   Musculoskeletal: Negative.   Neurological: Negative.   Psychiatric/Behavioral: Negative.    All other systems reviewed and are negative.   PHYSICAL EXAM: VS:  BP 120/70 (BP Location: Left Arm, Patient Position: Sitting, Cuff Size: Normal)   Pulse 89   Ht 5' 2 (1.575 m)   Wt 199 lb 2 oz (90.3 kg)   SpO2 96%   BMI 36.42 kg/m  , BMI Body mass index is 36.42 kg/m. GEN: Well nourished, well developed, in no acute distress  HEENT: normal  Neck: no JVD, carotid bruits, or masses Cardiac: RRR; no murmurs, rubs, or gallops,no edema  Respiratory:  clear to auscultation bilaterally, normal work of breathing GI: soft, nontender, nondistended, + BS MS: no deformity or atrophy  Skin: warm and dry, no rash Neuro:  Strength and sensation are intact Psych: euthymic mood, full affect   Recent Labs: 04/30/2022: ALT 27; BUN 22; Creatinine, Ser 0.57; Hemoglobin 14.8; Platelets 314; Potassium 5.1; Sodium 140    Lipid Panel Lab Results  Component Value Date   CHOL 150 04/30/2022   HDL 64 04/30/2022   LDLCALC 71 04/30/2022   TRIG 80 04/30/2022      Wt Readings from Last 3 Encounters:  02/22/23 199 lb 2 oz (90.3 kg)  01/17/23 204 lb (92.5 kg)  12/09/22 212 lb 9.6 oz (96.4 kg)       ASSESSMENT AND PLAN:  Coronary artery calcification - Plan: EKG 12-Lead Mild coronary calcifications on CT scan Mild aortic atherosclerosis She is willing to restart Crestor  10 mg daily Smoking cessation  recommended  Atherosclerosis of aorta (HCC) - Plan: EKG 12-Lead  aortic atherosclerosis, and coronary calcification Still smoking, will start Chantix  Restart Crestor  10 daily  Benign hypertension - Blood pressure is well controlled on today's visit. No changes made to the medications.  Dyslipidemia - Plan: EKG 12-Lead Off Crestor , recommend she restart Crestor  10 mg daily Denies history of side effects  Atypical chest pain Denies recent chest pain symptoms  Smoking Counseling provided, recommended she try Chantix , prescription sent in   Orders Placed This Encounter  Procedures   EKG 12-Lead     Signed, Velinda Lunger, M.D., Ph.D. 02/22/2023  [facility] [place], [place] [phone]

## 2023-02-22 ENCOUNTER — Ambulatory Visit: Payer: Medicare Other | Attending: Cardiovascular Disease | Admitting: Cardiovascular Disease

## 2023-02-22 ENCOUNTER — Encounter: Payer: Self-pay | Admitting: Cardiovascular Disease

## 2023-02-22 VITALS — BP 120/70 | HR 89 | Ht 62.0 in | Wt 199.1 lb

## 2023-02-22 DIAGNOSIS — I700 Atherosclerosis of aorta: Secondary | ICD-10-CM | POA: Insufficient documentation

## 2023-02-22 DIAGNOSIS — F17200 Nicotine dependence, unspecified, uncomplicated: Secondary | ICD-10-CM | POA: Insufficient documentation

## 2023-02-22 DIAGNOSIS — J432 Centrilobular emphysema: Secondary | ICD-10-CM | POA: Diagnosis present

## 2023-02-22 DIAGNOSIS — I2510 Atherosclerotic heart disease of native coronary artery without angina pectoris: Secondary | ICD-10-CM | POA: Diagnosis not present

## 2023-02-22 DIAGNOSIS — E785 Hyperlipidemia, unspecified: Secondary | ICD-10-CM | POA: Insufficient documentation

## 2023-02-22 DIAGNOSIS — I10 Essential (primary) hypertension: Secondary | ICD-10-CM | POA: Insufficient documentation

## 2023-02-22 MED ORDER — VARENICLINE TARTRATE 1 MG PO TABS
1.0000 mg | ORAL_TABLET | Freq: Two times a day (BID) | ORAL | 4 refills | Status: DC
Start: 2023-02-22 — End: 2023-12-08

## 2023-02-22 MED ORDER — ROSUVASTATIN CALCIUM 10 MG PO TABS: 10.0000 mg | ORAL_TABLET | Freq: Every day | ORAL | 3 refills | Status: DC

## 2023-02-22 NOTE — Patient Instructions (Addendum)
 Medication Instructions:  Please start crestor  10 mg daily  Please start chantix  1 mg twice a day  If you need a refill on your cardiac medications before your next appointment, please call your pharmacy.   Lab work: No new labs needed  Testing/Procedures: No new testing needed  Follow-Up: At [facility], you and your health needs are our priority.  As part of our continuing mission to provide you with exceptional heart care, we have created designated Provider Care Teams.  These Care Teams include your primary Cardiologist (physician) and Advanced Practice Providers (APPs -  Physician Assistants and Nurse Practitioners) who all work together to provide you with the care you need, when you need it.  You will need a follow up appointment in 12 months  Providers on your designated Care Team:   Lonni Meager, NP Bernardino Bring, PA-C Cadence [place], [place]  COVID-19 Vaccine Information can be found at: [email or web address] For questions related to vaccine distribution or appointments, please email vaccine@ [email or web address] or call [phone].

## 2023-04-25 ENCOUNTER — Other Ambulatory Visit: Payer: Self-pay

## 2023-05-03 ENCOUNTER — Ambulatory Visit

## 2023-06-06 ENCOUNTER — Ambulatory Visit
Admission: RE | Admit: 2023-06-06 | Discharge: 2023-06-06 | Disposition: A | Discharge status: Home / Self Care | Source: Ambulatory Visit | Attending: Acute Care | Admitting: Acute Care

## 2023-06-06 DIAGNOSIS — Z122 Encounter for screening for malignant neoplasm of respiratory organs: Secondary | ICD-10-CM | POA: Insufficient documentation

## 2023-06-06 DIAGNOSIS — Z87891 Personal history of nicotine dependence: Secondary | ICD-10-CM | POA: Insufficient documentation

## 2023-06-06 DIAGNOSIS — F17210 Nicotine dependence, cigarettes, uncomplicated: Secondary | ICD-10-CM | POA: Insufficient documentation

## 2023-06-10 ENCOUNTER — Encounter: Payer: Self-pay | Admitting: Nurse Practitioner

## 2023-06-10 ENCOUNTER — Telehealth: Payer: Self-pay | Admitting: Nurse Practitioner

## 2023-06-10 ENCOUNTER — Ambulatory Visit: Payer: Federal, State, Local not specified - PPO | Admitting: Nurse Practitioner

## 2023-06-10 VITALS — BP 122/74 | HR 90 | Temp 98.4°F | Resp 16 | Ht 62.0 in | Wt 193.0 lb

## 2023-06-10 DIAGNOSIS — Z1212 Encounter for screening for malignant neoplasm of rectum: Secondary | ICD-10-CM

## 2023-06-10 DIAGNOSIS — I700 Atherosclerosis of aorta: Secondary | ICD-10-CM | POA: Diagnosis not present

## 2023-06-10 DIAGNOSIS — Z0000 Encounter for general adult medical examination without abnormal findings: Secondary | ICD-10-CM | POA: Diagnosis not present

## 2023-06-10 DIAGNOSIS — Z1211 Encounter for screening for malignant neoplasm of colon: Secondary | ICD-10-CM

## 2023-06-10 DIAGNOSIS — Z1231 Encounter for screening mammogram for malignant neoplasm of breast: Secondary | ICD-10-CM | POA: Diagnosis not present

## 2023-06-10 DIAGNOSIS — I10 Essential (primary) hypertension: Secondary | ICD-10-CM | POA: Diagnosis not present

## 2023-06-10 DIAGNOSIS — F331 Major depressive disorder, recurrent, moderate: Secondary | ICD-10-CM

## 2023-06-10 DIAGNOSIS — G4709 Other insomnia: Secondary | ICD-10-CM

## 2023-06-10 DIAGNOSIS — F411 Generalized anxiety disorder: Secondary | ICD-10-CM

## 2023-06-10 MED ORDER — TRAZODONE HCL 100 MG PO TABS: 100.0000 mg | ORAL_TABLET | Freq: Every day | ORAL | 1 refills | Status: DC

## 2023-06-10 MED ORDER — SERTRALINE HCL 50 MG PO TABS: 50.0000 mg | ORAL_TABLET | Freq: Every day | ORAL | 2 refills | Status: DC

## 2023-06-10 NOTE — Telephone Encounter (Signed)
 Awaiting 06/10/23 office notes for GI referral-Toni

## 2023-06-10 NOTE — Progress Notes (Signed)
 [facility] [address] [place], [place] [address]  Internal MEDICINE  Office Visit Note  Patient Name: Veronica Mendez  [id number]  [id number]  Date of Service: 06/10/2023  Chief Complaint  Patient presents with   Depression   Hypertension   Hyperlipidemia   Medicare Wellness    HPI Vikki presents for a medicare annual wellness visit.  Well-appearing 73 y.o. female with  atherosclerosis, insomnia, emphysema and vitamin D  deficiency  Routine CRC screening: due in August this year  Routine mammogram: due in July this year  DEXA scan: done in 2008 Labs: due for routine labs  New or worsening pain: none Other concerns: none     06/10/2023   10:06 AM  MMSE - Mini Mental State Exam  Orientation to time 5  Orientation to Place 5  Registration 3  Attention/ Calculation 5  Recall 3  Language- name 2 objects 2  Language- repeat 1  Language- follow 3 step command 3  Language- read & follow direction 1  Write a sentence 1  Copy design 1  Total score 30    Functional Status Survey: Is the patient deaf or have difficulty hearing?: No Does the patient have difficulty seeing, even when wearing glasses/contacts?: No Does the patient have difficulty concentrating, remembering, or making decisions?: No Does the patient have difficulty walking or climbing stairs?: No Does the patient have difficulty dressing or bathing?: No Does the patient have difficulty doing errands alone such as visiting a doctor's office or shopping?: No     08/19/2021    2:16 PM 01/26/2022   11:32 AM 04/30/2022   10:47 AM 06/09/2022   10:54 AM 06/10/2023   10:05 AM  Fall Risk  Falls in the past year? 0 0 0 0 0  Was there an injury with Fall?   0 0 0  Fall Risk Category Calculator   0 0 0  (RETIRED) Patient Fall Risk Level Low fall risk Low fall risk     Patient at Risk for Falls Due to No Fall Risks No Fall Risks No Fall Risks No Fall Risks No Fall Risks  Fall risk Follow up Falls  prevention discussed Falls prevention discussed;Education provided;Falls evaluation completed Falls evaluation completed Falls evaluation completed Falls evaluation completed       06/10/2023   10:05 AM  Depression screen PHQ 2/9  Decreased Interest 0  Down, Depressed, Hopeless 0  PHQ - 2 Score 0       12/09/2022    9:49 AM 08/19/2021    2:16 PM 03/06/2021   11:04 AM 01/06/2021   12:14 PM  GAD 7 : Generalized Anxiety Score  Nervous, Anxious, on Edge 1 0 3 3  Control/stop worrying 2 0 2 3  Worry too much - different things 1 0 2 3  Trouble relaxing 1 0 3 3  Restless 0 0 3 3  Easily annoyed or irritable 3 0 1 0  Afraid - awful might happen 3 0 2 3  Total GAD 7 Score 11 0 16 18  Anxiety Difficulty Somewhat difficult  Somewhat difficult       Current Medication: Outpatient Encounter Medications as of 06/10/2023  Medication Sig   aspirin  EC (ASPIRIN  81) 81 MG tablet Take 81 mg by mouth once a week. Swallow whole.   magnesium oxide (MAG-OX) 400 (240 Mg) MG tablet Take 400 mg by mouth 3 (three) times a week.   olmesartan -hydrochlorothiazide  (BENICAR  HCT) 20-12.5 MG tablet Take 1 tablet by mouth  daily.   Phenyleph-Doxylamine-DM-APAP (ALKA-SELTZER PLS ALLERGY & CGH PO) Take by mouth.   pseudoephedrine (SUDAFED) 120 MG 12 hr tablet Take 120 mg by mouth 2 (two) times daily.   varenicline  (CHANTIX  CONTINUING MONTH PAK) 1 MG tablet Take 1 tablet (1 mg total) by mouth 2 (two) times daily.   vitamin B-12 (CYANOCOBALAMIN) 500 MCG tablet Take 500 mcg by mouth 3 (three) times a week.   Vitamin D , Cholecalciferol, 1000 units TABS Take 1 tablet by mouth daily.   [DISCONTINUED] sertraline  (ZOLOFT ) 50 MG tablet TAKE 1 TABLET BY MOUTH EVERY DAY   [DISCONTINUED] traZODone  (DESYREL ) 100 MG tablet TAKE 1 TABLET BY MOUTH EVERYDAY AT BEDTIME   rosuvastatin  (CRESTOR ) 10 MG tablet Take 1 tablet (10 mg total) by mouth daily.   sertraline  (ZOLOFT ) 50 MG tablet Take 1 tablet (50 mg total) by mouth daily.    traZODone  (DESYREL ) 100 MG tablet Take 1 tablet (100 mg total) by mouth at bedtime.   No facility-administered encounter medications on file as of 06/10/2023.    Surgical History: Past Surgical History:  Procedure Laterality Date   ABDOMINAL HYSTERECTOMY Bilateral 1988   COLONOSCOPY N/A 09/15/2020   Procedure: COLONOSCOPY;  Surgeon: Luke Salaam, MD;  Location: [facility] ENDOSCOPY;  Service: Gastroenterology;  Laterality: N/A;   KNEE SURGERY Left 2014    Medical History: Past Medical History:  Diagnosis Date   Depression    History of kidney stones    Hyperlipidemia    Hypertension    Obesity, Class III, BMI 40-49.9 (morbid obesity) (HCC) 11/10/2015    Family History: Family History  Problem Relation Age of Onset   Osteoarthritis Mother    Stroke Mother    Heart disease Father    Peripheral Artery Disease Sister    Diabetes Brother    Heart attack Brother    Diabetes Maternal Grandfather    Heart attack Paternal Uncle    Heart attack Paternal Uncle    Heart attack Paternal Uncle    Heart attack Paternal Uncle    Heart attack Paternal Uncle    Heart attack Paternal Uncle    Heart attack Paternal Uncle    Heart attack Brother    Atrial fibrillation Brother    Heart Problems Brother    Heart attack Brother    Stroke Brother    Heart disease Brother    Heart disease Other     Social History   Socioeconomic History   Marital status: Married    Spouse name: Archivist   Number of children: 1   Years of education: Not on file   Highest education level: Some college, no degree  Occupational History   Occupation: Retired  Tobacco Use   Smoking status: Every Day    Current packs/day: 1.00    Average packs/day: 1 pack/day for 50.5 years (50.5 ttl pk-yrs)    Types: Cigarettes    Start date: 05/26/2016   Smokeless tobacco: Never   Tobacco comments:    down to 5 cigarettes daily   Vaping Use   Vaping status: Never Used  Substance and Sexual Activity   Alcohol use: Not  Currently    Comment: rarely   Drug use: No   Sexual activity: Yes    Partners: Male  Other Topics Concern   Not on file  Social History Narrative   Not on file   Social Drivers of Health   Financial Resource Strain: Low Risk  (03/06/2021)   Overall Financial Resource Strain (CARDIA)  Difficulty of Paying Living Expenses: Not hard at all  Food Insecurity: No Food Insecurity (03/06/2021)   Hunger Vital Sign    Worried About Running Out of Food in the Last Year: Never true    Ran Out of Food in the Last Year: Never true  Transportation Needs: No Transportation Needs (03/06/2021)   PRAPARE - Administrator, Civil Service (Medical): No    Lack of Transportation (Non-Medical): No  Physical Activity: Sufficiently Active (03/06/2021)   Exercise Vital Sign    Days of Exercise per Week: 5 days    Minutes of Exercise per Session: 30 min  Stress: Stress Concern Present (03/06/2021)   [employer] of Occupational Health - Occupational Stress Questionnaire    Feeling of Stress : Very much  Social Connections: Moderately Integrated (03/06/2021)   Social Connection and Isolation Panel [NHANES]    Frequency of Communication with Friends and Family: Once a week    Frequency of Social Gatherings with Friends and Family: Once a week    Attends Religious Services: 1 to 4 times per year    Active Member of [employer] or Organizations: Yes    Attends Banker Meetings: 1 to 4 times per year    Marital Status: Married  Catering manager Violence: Not At Risk (03/06/2021)   Humiliation, Afraid, Rape, and Kick questionnaire    Fear of Current or Ex-Partner: No    Emotionally Abused: No    Physically Abused: No    Sexually Abused: No      Review of Systems  Constitutional:  Positive for fatigue. Negative for activity change, appetite change, chills, fever and unexpected weight change.  HENT: Negative.  Negative for congestion, ear pain, rhinorrhea, sore throat and trouble  swallowing.   Eyes: Negative.   Respiratory: Negative.  Negative for cough, chest tightness, shortness of breath and wheezing.   Cardiovascular: Negative.  Negative for chest pain and palpitations.  Gastrointestinal: Negative.  Negative for abdominal pain, blood in stool, constipation, diarrhea, nausea and vomiting.  Endocrine: Negative.   Genitourinary: Negative.  Negative for difficulty urinating, dysuria, frequency, hematuria and urgency.  Musculoskeletal: Negative.  Negative for arthralgias, back pain, joint swelling, myalgias and neck pain.  Skin: Negative.  Negative for rash and wound.  Allergic/Immunologic: Negative.  Negative for immunocompromised state.  Neurological: Negative.  Negative for dizziness, seizures, numbness and headaches.  Hematological: Negative.   Psychiatric/Behavioral:  Negative for behavioral problems, self-injury and suicidal ideas. The patient is not nervous/anxious.     Vital Signs: BP 122/74   Pulse 90   Temp 98.4 F (36.9 C)   Resp 16   Ht 5\' 2"  (1.575 m)   Wt 193 lb (87.5 kg)   SpO2 96%   BMI 35.30 kg/m    Physical Exam Vitals reviewed.  Constitutional:      General: She is not in acute distress.    Appearance: Normal appearance. She is well-developed and well-groomed. She is obese. She is not ill-appearing or diaphoretic.  HENT:     Head: Normocephalic and atraumatic.     Right Ear: Tympanic membrane, ear canal and external ear normal. There is no impacted cerumen.     Left Ear: Tympanic membrane, ear canal and external ear normal. There is no impacted cerumen.     Nose: Nose normal. No congestion or rhinorrhea.     Mouth/Throat:     Mouth: Mucous membranes are moist.     Pharynx: Oropharynx is clear. No oropharyngeal exudate or  posterior oropharyngeal erythema.  Eyes:     General: No scleral icterus.       Right eye: No discharge.        Left eye: No discharge.     Extraocular Movements: Extraocular movements intact.      Conjunctiva/sclera: Conjunctivae normal.     Pupils: Pupils are equal, round, and reactive to light.  Neck:     Thyroid: No thyromegaly.     Vascular: No JVD.     Trachea: No tracheal deviation.  Cardiovascular:     Rate and Rhythm: Normal rate and regular rhythm.     Pulses: Normal pulses.     Heart sounds: Normal heart sounds. No murmur heard.    No friction rub. No gallop.  Pulmonary:     Effort: Pulmonary effort is normal. No respiratory distress.     Breath sounds: Normal breath sounds. No stridor. No wheezing or rales.  Chest:     Chest wall: No tenderness.  Abdominal:     General: Bowel sounds are normal. There is no distension.     Palpations: Abdomen is soft. There is no mass.     Tenderness: There is no abdominal tenderness. There is no guarding or rebound.  Musculoskeletal:        General: No tenderness or deformity. Normal range of motion.     Cervical back: Normal range of motion and neck supple.  Lymphadenopathy:     Cervical: No cervical adenopathy.  Skin:    General: Skin is warm and dry.     Capillary Refill: Capillary refill takes less than 2 seconds.     Coloration: Skin is not pale.     Findings: No erythema or rash.  Neurological:     Mental Status: She is alert and oriented to person, place, and time.     Cranial Nerves: No cranial nerve deficit.     Motor: No abnormal muscle tone.     Coordination: Coordination normal.     Gait: Gait normal.     Deep Tendon Reflexes: Reflexes are normal and symmetric.  Psychiatric:        Mood and Affect: Mood normal.        Behavior: Behavior normal. Behavior is cooperative.        Thought Content: Thought content normal.        Judgment: Judgment normal.        Assessment/Plan: 1. Encounter for subsequent annual wellness visit (AWV) in Medicare patient (Primary) Age-appropriate preventive screenings and vaccinations discussed. Routine labs for health maintenance will be ordered. PHM updated.    2. Benign  hypertension Stable, continue olmesartan -hydrochlorothiazide  as prescribed.   3. Atherosclerosis of aorta (HCC) Continue rosuvastatin  as prescribed.   4. Other insomnia Continue trazodone  as prescribed.  - traZODone  (DESYREL ) 100 MG tablet; Take 1 tablet (100 mg total) by mouth at bedtime.  Dispense: 90 tablet; Refill: 1  5. Encounter for screening mammogram for malignant neoplasm of breast Routine mammogram ordered to be done in July  - MM 3D SCREENING MAMMOGRAM BILATERAL BREAST; Future  6. Screening for colorectal cancer Referred to GI for colonoscopy to be done in august - Ambulatory referral to Gastroenterology  7. Generalized anxiety disorder Continue sertraline  as prescribed.  - sertraline  (ZOLOFT ) 50 MG tablet; Take 1 tablet (50 mg total) by mouth daily.  Dispense: 90 tablet; Refill: 2  8. Moderate episode of recurrent major depressive disorder (HCC) Continue sertraline  as prescribed.  - sertraline  (ZOLOFT ) 50 MG tablet; Take 1  tablet (50 mg total) by mouth daily.  Dispense: 90 tablet; Refill: 2      General Counseling: Sierra verbalizes understanding of the findings of todays visit and agrees with plan of treatment. I have discussed any further diagnostic evaluation that may be needed or ordered today. We also reviewed her medications today. she has been encouraged to call the office with any questions or concerns that should arise related to todays visit.    Orders Placed This Encounter  Procedures   MM 3D SCREENING MAMMOGRAM BILATERAL BREAST   Ambulatory referral to Gastroenterology    Meds ordered this encounter  Medications   sertraline  (ZOLOFT ) 50 MG tablet    Sig: Take 1 tablet (50 mg total) by mouth daily.    Dispense:  90 tablet    Refill:  2   traZODone  (DESYREL ) 100 MG tablet    Sig: Take 1 tablet (100 mg total) by mouth at bedtime.    Dispense:  90 tablet    Refill:  1    Return in about 6 months (around 12/10/2023) for F/U, Rolena Knutson  PCP.   Total time spent:30 Minutes Time spent includes review of chart, medications, test results, and follow up plan with the patient.   [place] Controlled Substance Database was reviewed by me.  This patient was seen by Laurence Pons, FNP-C in collaboration with Dr. Verneta Gone as a part of collaborative care agreement.  Cambre Matson R. Bobbi Burow, MSN, FNP-C Internal medicine

## 2023-06-14 ENCOUNTER — Telehealth: Payer: Self-pay | Admitting: Nurse Practitioner

## 2023-06-14 NOTE — Telephone Encounter (Signed)
GI referral sent via [facility] to [facility]. Notified patient. Gave pt telephone # [phone] -Sheralyn Boatman

## 2023-06-15 ENCOUNTER — Telehealth: Payer: Self-pay | Admitting: Nurse Practitioner

## 2023-06-15 NOTE — Telephone Encounter (Signed)
 Per Bridgette Campus with [facility]: "Referral has been printed and will be held for Dr Anna/Dr Baldomero Bone when they transition to [facility] in June". I s/w patient. She is okay to wait-Toni

## 2023-07-01 ENCOUNTER — Other Ambulatory Visit: Payer: Self-pay

## 2023-07-01 DIAGNOSIS — F17210 Nicotine dependence, cigarettes, uncomplicated: Secondary | ICD-10-CM

## 2023-07-01 DIAGNOSIS — Z122 Encounter for screening for malignant neoplasm of respiratory organs: Secondary | ICD-10-CM

## 2023-07-01 DIAGNOSIS — Z87891 Personal history of nicotine dependence: Secondary | ICD-10-CM

## 2023-07-20 ENCOUNTER — Other Ambulatory Visit: Payer: Self-pay | Admitting: Nurse Practitioner

## 2023-07-20 DIAGNOSIS — I10 Essential (primary) hypertension: Secondary | ICD-10-CM

## 2023-08-30 ENCOUNTER — Other Ambulatory Visit: Payer: Self-pay | Admitting: Nurse Practitioner

## 2023-08-31 LAB — COMPREHENSIVE METABOLIC PANEL WITH GFR
ALT: 18 IU/L (ref 0–32)
AST: 18 IU/L (ref 0–40)
Albumin: 4.6 g/dL (ref 3.8–4.8)
Alkaline Phosphatase: 94 IU/L (ref 44–121)
BUN/Creatinine Ratio: 41 — ABNORMAL HIGH (ref 12–28)
BUN: 26 mg/dL (ref 8–27)
Bilirubin Total: 0.4 mg/dL (ref 0.0–1.2)
CO2: 20 mmol/L (ref 20–29)
Calcium: 10 mg/dL (ref 8.7–10.3)
Chloride: 103 mmol/L (ref 96–106)
Creatinine, Ser: 0.64 mg/dL (ref 0.57–1.00)
Globulin, Total: 2.2 g/dL (ref 1.5–4.5)
Glucose: 91 mg/dL (ref 70–99)
Potassium: 5 mmol/L (ref 3.5–5.2)
Sodium: 138 mmol/L (ref 134–144)
Total Protein: 6.8 g/dL (ref 6.0–8.5)
eGFR: 93 mL/min/1.73 (ref >59)

## 2023-08-31 LAB — LIPID PANEL
Chol/HDL Ratio: 2.5 ratio (ref 0.0–4.4)
Cholesterol, Total: 157 mg/dL (ref 100–199)
HDL: 62 mg/dL (ref >39)
LDL Chol Calc (NIH): 83 mg/dL (ref 0–99)
Triglycerides: 59 mg/dL (ref 0–149)
VLDL Cholesterol Cal: 12 mg/dL (ref 5–40)

## 2023-08-31 LAB — CBC WITH DIFFERENTIAL/PLATELET
Basophils Absolute: 0.1 x10E3/uL (ref 0.0–0.2)
Basos: 1 %
EOS (ABSOLUTE): 0.2 x10E3/uL (ref 0.0–0.4)
Eos: 2 %
Hematocrit: 44.9 % (ref 34.0–46.6)
Hemoglobin: 14.5 g/dL (ref 11.1–15.9)
Immature Grans (Abs): 0 x10E3/uL (ref 0.0–0.1)
Immature Granulocytes: 0 %
Lymphocytes Absolute: 2.1 x10E3/uL (ref 0.7–3.1)
Lymphs: 30 %
MCH: 30.9 pg (ref 26.6–33.0)
MCHC: 32.3 g/dL (ref 31.5–35.7)
MCV: 96 fL (ref 79–97)
Monocytes Absolute: 0.5 x10E3/uL (ref 0.1–0.9)
Monocytes: 7 %
Neutrophils Absolute: 4.3 x10E3/uL (ref 1.4–7.0)
Neutrophils: 60 %
Platelets: 304 x10E3/uL (ref 150–450)
RBC: 4.69 x10E6/uL (ref 3.77–5.28)
RDW: 12.4 % (ref 11.7–15.4)
WBC: 7.2 x10E3/uL (ref 3.4–10.8)

## 2023-08-31 LAB — B12 AND FOLATE PANEL
Folate: 7.6 ng/mL (ref >3.0)
Vitamin B-12: 1243 pg/mL (ref 232–1245)

## 2023-08-31 LAB — VITAMIN D 25 HYDROXY (VIT D DEFICIENCY, FRACTURES): Vit D, 25-Hydroxy: 28.6 ng/mL — ABNORMAL LOW (ref 30.0–100.0)

## 2023-10-13 ENCOUNTER — Ambulatory Visit
Admission: RE | Admit: 2023-10-13 | Discharge: 2023-10-13 | Disposition: A | Discharge status: Home / Self Care | Attending: Gastroenterology | Admitting: Gastroenterology

## 2023-10-13 ENCOUNTER — Ambulatory Visit: Payer: Self-pay | Admitting: Anesthesiology

## 2023-10-13 ENCOUNTER — Encounter
Admission: RE | Disposition: A | Discharge status: Home / Self Care | Payer: Self-pay | Source: Home / Self Care | Attending: Gastroenterology

## 2023-10-13 ENCOUNTER — Encounter: Payer: Self-pay | Admitting: Gastroenterology

## 2023-10-13 DIAGNOSIS — F17210 Nicotine dependence, cigarettes, uncomplicated: Secondary | ICD-10-CM | POA: Diagnosis not present

## 2023-10-13 DIAGNOSIS — I10 Essential (primary) hypertension: Secondary | ICD-10-CM | POA: Insufficient documentation

## 2023-10-13 DIAGNOSIS — K635 Polyp of colon: Secondary | ICD-10-CM | POA: Insufficient documentation

## 2023-10-13 DIAGNOSIS — J449 Chronic obstructive pulmonary disease, unspecified: Secondary | ICD-10-CM | POA: Diagnosis not present

## 2023-10-13 DIAGNOSIS — D122 Benign neoplasm of ascending colon: Secondary | ICD-10-CM | POA: Insufficient documentation

## 2023-10-13 DIAGNOSIS — I2510 Atherosclerotic heart disease of native coronary artery without angina pectoris: Secondary | ICD-10-CM | POA: Diagnosis not present

## 2023-10-13 DIAGNOSIS — Z1211 Encounter for screening for malignant neoplasm of colon: Secondary | ICD-10-CM | POA: Diagnosis present

## 2023-10-13 HISTORY — PX: POLYPECTOMY: SHX149

## 2023-10-13 HISTORY — PX: COLONOSCOPY: SHX5424

## 2023-10-13 SURGERY — COLONOSCOPY
Anesthesia: General

## 2023-10-13 MED ORDER — LIDOCAINE HCL (CARDIAC) PF 100 MG/5ML IV SOSY
PREFILLED_SYRINGE | INTRAVENOUS | Status: DC | PRN
  Administered 2023-10-13: 100 mg via INTRAVENOUS

## 2023-10-13 MED ORDER — PROPOFOL 500 MG/50ML IV EMUL
INTRAVENOUS | Status: DC | PRN
  Administered 2023-10-13: 165 ug/kg/min via INTRAVENOUS

## 2023-10-13 MED ORDER — PROPOFOL 1000 MG/100ML IV EMUL
INTRAVENOUS | Status: AC
Start: 2023-10-13 — End: 2023-10-13
  Filled 2023-10-13: qty 500

## 2023-10-13 MED ORDER — PROPOFOL 10 MG/ML IV BOLUS
INTRAVENOUS | Status: DC | PRN
  Administered 2023-10-13: 60 mg via INTRAVENOUS
  Administered 2023-10-13: 20 mg via INTRAVENOUS

## 2023-10-13 MED ORDER — GLYCOPYRROLATE 0.2 MG/ML IJ SOLN
INTRAMUSCULAR | Status: DC | PRN
  Administered 2023-10-13: .2 mg via INTRAVENOUS

## 2023-10-13 MED ORDER — SODIUM CHLORIDE 0.9 % IV SOLN: INTRAVENOUS | Status: DC

## 2023-10-13 NOTE — Anesthesia Preprocedure Evaluation (Addendum)
 Anesthesia Evaluation  Patient identified by MRN, date of birth, ID band Patient awake    Reviewed: Allergy & Precautions, NPO status , Patient's Chart, lab work & pertinent test results  History of Anesthesia Complications Negative for: history of anesthetic complications  Airway Mallampati: III  TM Distance: >3 FB Neck ROM: full    Dental no notable dental hx.    Pulmonary COPD, Current Smoker   Pulmonary exam normal        Cardiovascular hypertension, + CAD  Normal cardiovascular exam     Neuro/Psych  PSYCHIATRIC DISORDERS Anxiety Depression     Neuromuscular disease    GI/Hepatic negative GI ROS, Neg liver ROS,,,  Endo/Other  negative endocrine ROS    Renal/GU negative Renal ROS  negative genitourinary   Musculoskeletal   Abdominal   Peds  Hematology negative hematology ROS (+)   Anesthesia Other Findings Past Medical History: No date: Depression No date: History of kidney stones No date: Hyperlipidemia No date: Hypertension 11/10/2015: Obesity, Class III, BMI 40-49.9 (morbid obesity)  Past Surgical History: 1988: ABDOMINAL HYSTERECTOMY; Bilateral 09/15/2020: COLONOSCOPY; N/A     Comment:  Procedure: COLONOSCOPY;  Surgeon: Therisa Bi, MD;                Location: [facility] ENDOSCOPY;  Service: Gastroenterology;                Laterality: N/A; 2014: KNEE SURGERY; Left  BMI    Body Mass Index: 35.30 kg/m      Reproductive/Obstetrics negative OB ROS                              Anesthesia Physical Anesthesia Plan  ASA: 2  Anesthesia Plan: General   Post-op Pain Management: Minimal or no pain anticipated   Induction: Intravenous  PONV Risk Score and Plan: 2 and Propofol  infusion and TIVA  Airway Management Planned: Natural Airway and Nasal Cannula  Additional Equipment:   Intra-op Plan:   Post-operative Plan:   Informed Consent: I have reviewed the patients  History and Physical, chart, labs and discussed the procedure including the risks, benefits and alternatives for the proposed anesthesia with the patient or authorized representative who has indicated his/her understanding and acceptance.     Dental Advisory Given  Plan Discussed with: Anesthesiologist, CRNA and Surgeon  Anesthesia Plan Comments: (Patient consented for risks of anesthesia including but not limited to:  - adverse reactions to medications - risk of airway placement if required - damage to eyes, teeth, lips or other oral mucosa - nerve damage due to positioning  - sore throat or hoarseness - Damage to heart, brain, nerves, lungs, other parts of body or loss of life  Patient voiced understanding and assent.)         Anesthesia Quick Evaluation

## 2023-10-13 NOTE — Transfer of Care (Signed)
 Immediate Anesthesia Transfer of Care Note  Patient: Veronica Mendez  Procedure(s) Performed: COLONOSCOPY POLYPECTOMY, INTESTINE  Patient Location: Endoscopy Unit  Anesthesia Type:General  Level of Consciousness: drowsy and patient cooperative  Airway & Oxygen Therapy: Patient Spontanous Breathing and Patient connected to face mask oxygen  Post-op Assessment: Report given to RN and Post -op Vital signs reviewed and stable  Post vital signs: Reviewed and stable  Last Vitals:  Vitals Value Taken Time  BP 136/83 10/13/23 08:09  Temp 35.8 C 10/13/23 08:08  Pulse 93 10/13/23 08:12  Resp 20 10/13/23 08:12  SpO2 97 % 10/13/23 08:12  Vitals shown include unfiled device data.  Last Pain:  Vitals:   10/13/23 0808  TempSrc: Temporal  PainSc: 0-No pain         Complications: No notable events documented.

## 2023-10-13 NOTE — Anesthesia Postprocedure Evaluation (Signed)
 Anesthesia Post Note  Patient: Veronica Mendez  Procedure(s) Performed: COLONOSCOPY POLYPECTOMY, INTESTINE  Patient location during evaluation: Endoscopy Anesthesia Type: General Level of consciousness: awake and alert Pain management: pain level controlled Vital Signs Assessment: post-procedure vital signs reviewed and stable Respiratory status: spontaneous breathing, nonlabored ventilation, respiratory function stable and patient connected to nasal cannula oxygen Cardiovascular status: blood pressure returned to baseline and stable Postop Assessment: no apparent nausea or vomiting Anesthetic complications: no   No notable events documented.   Last Vitals:  Vitals:   10/13/23 0818 10/13/23 0827  BP: 129/81 133/74  Pulse: 94 85  Resp: 12 16  Temp:  (!) 35.8 C  SpO2: 99% 98%    Last Pain:  Vitals:   10/13/23 0827  TempSrc: Temporal  PainSc: 0-No pain                 Veronica Mendez

## 2023-10-13 NOTE — Op Note (Signed)
 [facility] Gastroenterology Patient Name: Veronica Mendez Procedure Date: 10/13/2023 7:00 AM MRN: [id number] Account #: [id number] Date of Birth: Apr 07, 1950 Admit Type: Outpatient Age: 73 Room: [facility] ENDO ROOM 2 Gender: Female Note Status: Finalized Instrument Name: Colon Scope [id number] Procedure:             Colonoscopy Indications:           Surveillance: Personal history of adenomatous polyps                         on last colonoscopy 3 years ago, Last colonoscopy:                         August 2022 Providers:             Ruel Kung MD, MD Referring MD:          Mardy Maxin (Referring MD) Medicines:             Monitored Anesthesia Care Complications:         No immediate complications. Procedure:             Pre-Anesthesia Assessment:                        - Prior to the procedure, a History and Physical was                         performed, and patient medications, allergies and                         sensitivities were reviewed. The patient's tolerance                         of previous anesthesia was reviewed.                        - The risks and benefits of the procedure and the                         sedation options and risks were discussed with the                         patient. All questions were answered and informed                         consent was obtained.                        - ASA Grade Assessment: II - A patient with mild                         systemic disease.                        After obtaining informed consent, the colonoscope was                         passed under direct vision. Throughout the procedure,                         the patient's blood pressure,  pulse, and oxygen                         saturations were monitored continuously. The                         Colonoscope was introduced through the anus and                         advanced to the the cecum, identified by the                          appendiceal orifice. The colonoscopy was performed                         with ease. The patient tolerated the procedure well.                         The quality of the bowel preparation was excellent.                         The ileocecal valve, appendiceal orifice, and rectum                         were photographed. Findings:      The perianal and digital rectal examinations were normal.      Two sessile polyps were found in the sigmoid colon and cecum. The polyps       were 3 to 4 mm in size. These polyps were removed with a cold snare.       Resection and retrieval were complete.      Three sessile polyps were found in the ascending colon. The polyps were       4 to 5 mm in size. These polyps were removed with a cold snare.       Resection and retrieval were complete.      The exam was otherwise without abnormality on direct and retroflexion       views. Impression:            - Two 3 to 4 mm polyps in the sigmoid colon and in the                         cecum, removed with a cold snare. Resected and                         retrieved.                        - Three 4 to 5 mm polyps in the ascending colon,                         removed with a cold snare. Resected and retrieved.                        - The examination was otherwise normal on direct and                         retroflexion views. Recommendation:        - Discharge patient to  home (with escort).                        - Resume previous diet.                        - Continue present medications.                        - Await pathology results.                        - Repeat colonoscopy for surveillance based on                         pathology results. Procedure Code(s):     --- Professional ---                        [phone], Colonoscopy, flexible; with removal of                         tumor(s), polyp(s), or other lesion(s) by snare                         technique Diagnosis Code(s):     --- Professional  ---                        Z86.010, Personal history of colonic polyps                        D12.5, Benign neoplasm of sigmoid colon                        D12.0, Benign neoplasm of cecum                        D12.2, Benign neoplasm of ascending colon CPT copyright 2022 American Medical Association. All rights reserved. The codes documented in this report are preliminary and upon coder review may  be revised to meet current compliance requirements. Ruel Kung, MD Ruel Kung MD, MD 10/13/2023 8:08:37 AM This report has been signed electronically. Number of Addenda: 0 Note Initiated On: 10/13/2023 7:00 AM Scope Withdrawal Time: 0 hours 11 minutes 47 seconds  Total Procedure Duration: 0 hours 15 minutes 54 seconds  Estimated Blood Loss:  Estimated blood loss: none.      [facility]

## 2023-10-13 NOTE — Anesthesia Procedure Notes (Signed)
 Procedure Name: General with mask airway Date/Time: 10/13/2023 7:49 AM  Performed by: Ledora Duncan, CRNAPre-anesthesia Checklist: Patient identified, Emergency Drugs available, Suction available and Patient being monitored Patient Re-evaluated:Patient Re-evaluated prior to induction Oxygen Delivery Method: Simple face mask Induction Type: IV induction Placement Confirmation: positive ETCO2 and breath sounds checked- equal and bilateral Dental Injury: Teeth and Oropharynx as per pre-operative assessment

## 2023-10-13 NOTE — H&P (Signed)
 Ruel Kung , MD [address], [place], [place], [address] Phone: [phone] Fax: [phone]  Primary Care Physician:  Liana Fish, NP   Pre-Procedure History & Physical: HPI:  Veronica Mendez is a 73 y.o. female is here for an colonoscopy.   Past Medical History:  Diagnosis Date   Depression    History of kidney stones    Hyperlipidemia    Hypertension    Obesity, Class III, BMI 40-49.9 (morbid obesity) 11/10/2015    Past Surgical History:  Procedure Laterality Date   ABDOMINAL HYSTERECTOMY Bilateral 1988   COLONOSCOPY N/A 09/15/2020   Procedure: COLONOSCOPY;  Surgeon: Kung Ruel, MD;  Location: [facility] ENDOSCOPY;  Service: Gastroenterology;  Laterality: N/A;   KNEE SURGERY Left 2014    Prior to Admission medications   Medication Sig Start Date End Date Taking? Authorizing Provider  olmesartan -hydrochlorothiazide  (BENICAR  HCT) 20-12.5 MG tablet TAKE 1 TABLET BY MOUTH EVERY DAY 07/20/23  Yes Abernathy, Alyssa, NP  semaglutide-weight management (WEGOVY) 0.25 MG/0.5ML SOAJ SQ injection Inject 0.25 mg into the skin once a week.   Yes [provider]  aspirin  EC (ASPIRIN  81) 81 MG tablet Take 81 mg by mouth once a week. Swallow whole.    [provider]  magnesium oxide (MAG-OX) 400 (240 Mg) MG tablet Take 400 mg by mouth 3 (three) times a week.    [provider]  Phenyleph-Doxylamine-DM-APAP (ALKA-SELTZER PLS ALLERGY & CGH PO) Take by mouth.    [provider]  pseudoephedrine (SUDAFED) 120 MG 12 hr tablet Take 120 mg by mouth 2 (two) times daily.    [provider]  rosuvastatin  (CRESTOR ) 10 MG tablet Take 1 tablet (10 mg total) by mouth daily. 02/22/23 05/23/23  Gollan, Timothy J, MD  sertraline  (ZOLOFT ) 50 MG tablet Take 1 tablet (50 mg total) by mouth daily. Patient not taking: Reported on 10/13/2023 06/10/23   Liana Fish, NP  traZODone  (DESYREL ) 100 MG tablet Take 1 tablet (100 mg total) by mouth at bedtime.  06/10/23   Liana Fish, NP  varenicline  (CHANTIX  CONTINUING MONTH PAK) 1 MG tablet Take 1 tablet (1 mg total) by mouth 2 (two) times daily. Patient not taking: Reported on 10/13/2023 02/22/23   Gollan, Timothy J, MD  vitamin B-12 (CYANOCOBALAMIN) 500 MCG tablet Take 500 mcg by mouth 3 (three) times a week.    [provider]  Vitamin D , Cholecalciferol, 1000 units TABS Take 1 tablet by mouth daily.    [provider]    Allergies as of 09/28/2023   (No Known Allergies)    Family History  Problem Relation Age of Onset   Osteoarthritis Mother    Stroke Mother    Heart disease Father    Peripheral Artery Disease Sister    Diabetes Brother    Heart attack Brother    Diabetes Maternal Grandfather    Heart attack Paternal Uncle    Heart attack Paternal Uncle    Heart attack Paternal Uncle    Heart attack Paternal Uncle    Heart attack Paternal Uncle    Heart attack Paternal Uncle    Heart attack Paternal Uncle    Heart attack Brother    Atrial fibrillation Brother    Heart Problems Brother    Heart attack Brother    Stroke Brother    Heart disease Brother    Heart disease Other     Social History   Socioeconomic History   Marital status: Married  Spouse name: Salomon   Number of children: 1   Years of education: Not on file   Highest education level: Some college, no degree  Occupational History   Occupation: Retired  Tobacco Use   Smoking status: Every Day    Current packs/day: 1.00    Average packs/day: 1 pack/day for 50.9 years (50.9 ttl pk-yrs)    Types: Cigarettes    Start date: 05/26/2016   Smokeless tobacco: Never   Tobacco comments:    down to 5 cigarettes daily   Vaping Use   Vaping status: Never Used  Substance and Sexual Activity   Alcohol use: Not Currently    Comment: rarely   Drug use: No   Sexual activity: Yes    Partners: Male  Other Topics Concern   Not on file  Social History Narrative   Not on file   Social Drivers of  Health   Financial Resource Strain: Low Risk  (03/06/2021)   Overall Financial Resource Strain (CARDIA)    Difficulty of Paying Living Expenses: Not hard at all  Food Insecurity: No Food Insecurity (03/06/2021)   Hunger Vital Sign    Worried About Running Out of Food in the Last Year: Never true    Ran Out of Food in the Last Year: Never true  Transportation Needs: No Transportation Needs (03/06/2021)   PRAPARE - Administrator, Civil Service (Medical): No    Lack of Transportation (Non-Medical): No  Physical Activity: Sufficiently Active (03/06/2021)   Exercise Vital Sign    Days of Exercise per Week: 5 days    Minutes of Exercise per Session: 30 min  Stress: Stress Concern Present (03/06/2021)   [employer] of Occupational Health - Occupational Stress Questionnaire    Feeling of Stress : Very much  Social Connections: Moderately Integrated (03/06/2021)   Social Connection and Isolation Panel    Frequency of Communication with Friends and Family: Once a week    Frequency of Social Gatherings with Friends and Family: Once a week    Attends Religious Services: 1 to 4 times per year    Active Member of [employer] or Organizations: Yes    Attends Banker Meetings: 1 to 4 times per year    Marital Status: Married  Catering manager Violence: Not At Risk (03/06/2021)   Humiliation, Afraid, Rape, and Kick questionnaire    Fear of Current or Ex-Partner: No    Emotionally Abused: No    Physically Abused: No    Sexually Abused: No    Review of Systems: See HPI, otherwise negative ROS  Physical Exam: BP 125/72   Pulse 92   Temp (!) 96.9 F (36.1 C) (Temporal)   Resp 18   Ht 5' 2 (1.575 m)   Wt 87.5 kg   SpO2 98%   BMI 35.30 kg/m  General:   Alert,  pleasant and cooperative in NAD Head:  Normocephalic and atraumatic. Neck:  Supple; no masses or thyromegaly. Lungs:  Clear throughout to auscultation, normal respiratory effort.    Heart:  +S1, +S2, Regular  rate and rhythm, No edema. Abdomen:  Soft, nontender and nondistended. Normal bowel sounds, without guarding, and without rebound.   Neurologic:  Alert and  oriented x4;  grossly normal neurologically.  Impression/Plan: Lorenda LITTIE Remington is here for an colonoscopy to be performed for surveillance due to prior history of colon polyps   Risks, benefits, limitations, and alternatives regarding  colonoscopy have been reviewed with the patient.  Questions  have been answered.  All parties agreeable.   Ruel Kung, MD  10/13/2023, 7:45 AM

## 2023-10-14 LAB — SURGICAL PATHOLOGY

## 2023-12-08 ENCOUNTER — Ambulatory Visit (INDEPENDENT_AMBULATORY_CARE_PROVIDER_SITE_OTHER): Admitting: Nurse Practitioner

## 2023-12-08 ENCOUNTER — Encounter: Payer: Self-pay | Admitting: Nurse Practitioner

## 2023-12-08 VITALS — BP 120/74 | HR 91 | Temp 97.0°F | Resp 16 | Ht 62.0 in | Wt 201.0 lb

## 2023-12-08 DIAGNOSIS — I10 Essential (primary) hypertension: Secondary | ICD-10-CM

## 2023-12-08 DIAGNOSIS — J432 Centrilobular emphysema: Secondary | ICD-10-CM

## 2023-12-08 DIAGNOSIS — I700 Atherosclerosis of aorta: Secondary | ICD-10-CM | POA: Diagnosis not present

## 2023-12-08 MED ORDER — OLMESARTAN MEDOXOMIL-HCTZ 20-12.5 MG PO TABS: 1.0000 | ORAL_TABLET | Freq: Every day | ORAL | 1 refills | Status: AC

## 2023-12-08 NOTE — Progress Notes (Signed)
 [facility] [address] [place], [place] [address]  Internal MEDICINE  Office Visit Note  Patient Name: Veronica Mendez  [id number]  [id number]  Date of Service: 12/08/2023  Chief Complaint  Patient presents with   Depression   Hypertension   Hyperlipidemia   Follow-up    HPI Tiane presents for a follow-up visit for hypertension, labs, screenings and emphysema.  Hypertension -- controlled with olmesartan -hydrochlorothiazide   Atherosclerosis but normal cholesterol panel -- was taking rosuvastatin  but stopped taking it due to the information about statins being possibly linked to dementia.  Due for flu vaccine, getting high dose at pharmacy Had colonoscopy done in August, repeat in 3 years.  Emphysema -- not currently on any inhalers. Feels like she could need one. Has not had a PFT done before.     Current Medication: Outpatient Encounter Medications as of 12/08/2023  Medication Sig   olmesartan -hydrochlorothiazide  (BENICAR  HCT) 20-12.5 MG tablet TAKE 1 TABLET BY MOUTH EVERY DAY   Phenyleph-Doxylamine-DM-APAP (ALKA-SELTZER PLS ALLERGY & CGH PO) Take by mouth. (Patient not taking: Reported on 12/08/2023)   pseudoephedrine (SUDAFED) 120 MG 12 hr tablet Take 120 mg by mouth 2 (two) times daily.   rosuvastatin  (CRESTOR ) 10 MG tablet Take 1 tablet (10 mg total) by mouth daily.   semaglutide-weight management (WEGOVY) 0.25 MG/0.5ML SOAJ SQ injection Inject 0.25 mg into the skin once a week.   vitamin B-12 (CYANOCOBALAMIN) 500 MCG tablet Take 500 mcg by mouth 3 (three) times a week.   Vitamin D , Cholecalciferol, 1000 units TABS Take 1 tablet by mouth daily.   [DISCONTINUED] aspirin  EC (ASPIRIN  81) 81 MG tablet Take 81 mg by mouth once a week. Swallow whole. (Patient not taking: Reported on 12/08/2023)   [DISCONTINUED] magnesium oxide (MAG-OX) 400 (240 Mg) MG tablet Take 400 mg by mouth 3 (three) times a week. (Patient not taking: Reported on 12/08/2023)    [DISCONTINUED] sertraline  (ZOLOFT ) 50 MG tablet Take 1 tablet (50 mg total) by mouth daily. (Patient not taking: Reported on 10/13/2023)   [DISCONTINUED] traZODone  (DESYREL ) 100 MG tablet Take 1 tablet (100 mg total) by mouth at bedtime. (Patient not taking: Reported on 12/08/2023)   [DISCONTINUED] varenicline  (CHANTIX  CONTINUING MONTH PAK) 1 MG tablet Take 1 tablet (1 mg total) by mouth 2 (two) times daily. (Patient not taking: Reported on 12/08/2023)   No facility-administered encounter medications on file as of 12/08/2023.    Surgical History: Past Surgical History:  Procedure Laterality Date   ABDOMINAL HYSTERECTOMY Bilateral 1988   COLONOSCOPY N/A 09/15/2020   Procedure: COLONOSCOPY;  Surgeon: Therisa Bi, MD;  Location: [facility] ENDOSCOPY;  Service: Gastroenterology;  Laterality: N/A;   COLONOSCOPY N/A 10/13/2023   Procedure: COLONOSCOPY;  Surgeon: Therisa Bi, MD;  Location: [facility] ENDOSCOPY;  Service: Gastroenterology;  Laterality: N/A;  on Wegovy patient requesting morning appt   KNEE SURGERY Left 2014   POLYPECTOMY  10/13/2023   Procedure: POLYPECTOMY, INTESTINE;  Surgeon: Therisa Bi, MD;  Location: [facility] ENDOSCOPY;  Service: Gastroenterology;;    Medical History: Past Medical History:  Diagnosis Date   Depression    History of kidney stones    Hyperlipidemia    Hypertension    Obesity, Class III, BMI 40-49.9 (morbid obesity) (HCC) 11/10/2015    Family History: Family History  Problem Relation Age of Onset   Osteoarthritis Mother    Stroke Mother    Heart disease Father    Peripheral Artery Disease Sister    Diabetes Brother    Heart attack Brother  Diabetes Maternal Grandfather    Heart attack Paternal Uncle    Heart attack Paternal Uncle    Heart attack Paternal Uncle    Heart attack Paternal Uncle    Heart attack Paternal Uncle    Heart attack Paternal Uncle    Heart attack Paternal Uncle    Heart attack Brother    Atrial fibrillation Brother    Heart Problems  Brother    Heart attack Brother    Stroke Brother    Heart disease Brother    Heart disease Other     Social History   Socioeconomic History   Marital status: Married    Spouse name: Archivist   Number of children: 1   Years of education: Not on file   Highest education level: Some college, no degree  Occupational History   Occupation: Retired  Tobacco Use   Smoking status: Every Day    Current packs/day: 1.00    Average packs/day: 1 pack/day for 51.0 years (51.0 ttl pk-yrs)    Types: Cigarettes    Start date: 05/26/2016   Smokeless tobacco: Never   Tobacco comments:    down to 5 cigarettes daily   Vaping Use   Vaping status: Never Used  Substance and Sexual Activity   Alcohol use: Yes    Comment: rarely   Drug use: No   Sexual activity: Yes    Partners: Male  Other Topics Concern   Not on file  Social History Narrative   Not on file   Social Drivers of Health   Financial Resource Strain: Low Risk  (03/06/2021)   Overall Financial Resource Strain (CARDIA)    Difficulty of Paying Living Expenses: Not hard at all  Food Insecurity: No Food Insecurity (03/06/2021)   Hunger Vital Sign    Worried About Running Out of Food in the Last Year: Never true    Ran Out of Food in the Last Year: Never true  Transportation Needs: No Transportation Needs (03/06/2021)   PRAPARE - Administrator, Civil Service (Medical): No    Lack of Transportation (Non-Medical): No  Physical Activity: Sufficiently Active (03/06/2021)   Exercise Vital Sign    Days of Exercise per Week: 5 days    Minutes of Exercise per Session: 30 min  Stress: Stress Concern Present (03/06/2021)   [employer] of Occupational Health - Occupational Stress Questionnaire    Feeling of Stress : Very much  Social Connections: Moderately Integrated (03/06/2021)   Social Connection and Isolation Panel    Frequency of Communication with Friends and Family: Once a week    Frequency of Social Gatherings with  Friends and Family: Once a week    Attends Religious Services: 1 to 4 times per year    Active Member of [employer] or Organizations: Yes    Attends Banker Meetings: 1 to 4 times per year    Marital Status: Married  Catering manager Violence: Not At Risk (03/06/2021)   Humiliation, Afraid, Rape, and Kick questionnaire    Fear of Current or Ex-Partner: No    Emotionally Abused: No    Physically Abused: No    Sexually Abused: No      Review of Systems  Constitutional:  Negative for chills, fatigue and unexpected weight change.  HENT:  Negative for congestion, rhinorrhea, sneezing and sore throat.   Eyes:  Negative for redness.  Respiratory:  Negative for cough, chest tightness and shortness of breath.   Cardiovascular:  Negative for chest  pain and palpitations.  Gastrointestinal:  Negative for abdominal pain, constipation, diarrhea, nausea and vomiting.  Genitourinary:  Negative for dysuria and frequency.  Musculoskeletal:  Negative for arthralgias, back pain, joint swelling and neck pain.  Skin:  Negative for rash.  Neurological: Negative.  Negative for tremors and numbness.  Hematological:  Negative for adenopathy. Does not bruise/bleed easily.  Psychiatric/Behavioral:  Negative for behavioral problems (Depression), sleep disturbance and suicidal ideas. The patient is not nervous/anxious.     Vital Signs: BP 120/74   Pulse 91   Temp (!) 97 F (36.1 C)   Resp 16   Ht 5' 2 (1.575 m)   Wt 201 lb (91.2 kg)   SpO2 96%   BMI 36.76 kg/m    Physical Exam Vitals reviewed.  Constitutional:      General: She is not in acute distress.    Appearance: Normal appearance. She is obese. She is not ill-appearing.  HENT:     Head: Normocephalic and atraumatic.  Eyes:     Pupils: Pupils are equal, round, and reactive to light.  Cardiovascular:     Rate and Rhythm: Normal rate and regular rhythm.  Pulmonary:     Effort: Pulmonary effort is normal. No respiratory distress.   Neurological:     Mental Status: She is alert and oriented to person, place, and time.  Psychiatric:        Mood and Affect: Mood normal.        Behavior: Behavior normal.        Assessment/Plan: 1. Essential (primary) hypertension (Primary) Stable, continue olmesartan -hydrochlorothiazide  as prescribed.  - olmesartan -hydrochlorothiazide  (BENICAR  HCT) 20-12.5 MG tablet; Take 1 tablet by mouth daily.  Dispense: 90 tablet; Refill: 1  2. Atherosclerosis of aorta Labs are normal. Hold rosuvastatin  for now, will discuss statin therapy after next labs in a few more months   3. Centrilobular emphysema (HCC) PFT ordered, follow up in a couple of months  - Pulmonary function test; Future   General Counseling: Ambree verbalizes understanding of the findings of todays visit and agrees with plan of treatment. I have discussed any further diagnostic evaluation that may be needed or ordered today. We also reviewed her medications today. she has been encouraged to call the office with any questions or concerns that should arise related to todays visit.    Orders Placed This Encounter  Procedures   Pulmonary function test    No orders of the defined types were placed in this encounter.   Return in about 2 months (around 02/07/2024) for F/U, PFT @ [place] and follow up to discuss results. .   Total time spent:30 Minutes Time spent includes review of chart, medications, test results, and follow up plan with the patient.   [place] Controlled Substance Database was reviewed by me.  This patient was seen by Mardy Maxin, FNP-C in collaboration with Dr. Sigrid Bathe as a part of collaborative care agreement.   Leyna Vanderkolk R. Maxin, MSN, FNP-C Internal medicine

## 2023-12-14 ENCOUNTER — Ambulatory Visit (INDEPENDENT_AMBULATORY_CARE_PROVIDER_SITE_OTHER): Admitting: Internal Medicine

## 2023-12-14 DIAGNOSIS — J432 Centrilobular emphysema: Secondary | ICD-10-CM | POA: Diagnosis not present

## 2023-12-19 ENCOUNTER — Ambulatory Visit: Admitting: Nurse Practitioner

## 2023-12-22 ENCOUNTER — Encounter: Payer: Self-pay | Admitting: Nurse Practitioner

## 2023-12-22 ENCOUNTER — Ambulatory Visit (INDEPENDENT_AMBULATORY_CARE_PROVIDER_SITE_OTHER): Admitting: Nurse Practitioner

## 2023-12-22 VITALS — BP 117/79 | HR 86 | Temp 98.1°F | Resp 16 | Ht 62.0 in | Wt 200.0 lb

## 2023-12-22 DIAGNOSIS — J432 Centrilobular emphysema: Secondary | ICD-10-CM

## 2023-12-22 DIAGNOSIS — I10 Essential (primary) hypertension: Secondary | ICD-10-CM

## 2023-12-22 DIAGNOSIS — I700 Atherosclerosis of aorta: Secondary | ICD-10-CM | POA: Diagnosis not present

## 2023-12-22 MED ORDER — ROSUVASTATIN CALCIUM 10 MG PO TABS: 10.0000 mg | ORAL_TABLET | Freq: Every day | ORAL | 3 refills | Status: AC

## 2023-12-22 NOTE — Progress Notes (Signed)
 [facility] [address] [place], [place] [address]  Internal MEDICINE  Office Visit Note  Patient Name: Veronica Mendez  [id number]  [id number]  Date of Service: 12/22/2023  Chief Complaint  Patient presents with   Follow-up    Review pft     HPI Veronica Mendez presents for a follow-up visit for emphysema, PFT results, high cholesterol, and hypertension Emphysema -- PFT was normal with FEV1 at 99-108%, and FVC is also normal. Waiting for final report from Dr. Elfreda Bathe. She has not had any significant symptoms requiring any regular use of a maintenance or rescue inhaler. She denies any SOB, chest tightness, cough or wheezing. She was smoking about 1 ppd of cigarettes. She reports that she is down to 5 cigarettes per day . She is ready to quit and is working on this in her own time. She is gradually tapering off.  High cholesterol -- takes rosuvastatin  daily.  Hypertension -- her BP is well controlled with olmesartan -hydrochlorothiazide    Current Medication: Outpatient Encounter Medications as of 12/22/2023  Medication Sig   olmesartan -hydrochlorothiazide  (BENICAR  HCT) 20-12.5 MG tablet Take 1 tablet by mouth daily.   pseudoephedrine (SUDAFED) 120 MG 12 hr tablet Take 120 mg by mouth 2 (two) times daily.   rosuvastatin  (CRESTOR ) 10 MG tablet Take 1 tablet (10 mg total) by mouth daily.   semaglutide-weight management (WEGOVY) 0.25 MG/0.5ML SOAJ SQ injection Inject 0.25 mg into the skin once a week.   vitamin B-12 (CYANOCOBALAMIN) 500 MCG tablet Take 500 mcg by mouth 3 (three) times a week.   Vitamin D , Cholecalciferol, 1000 units TABS Take 1 tablet by mouth daily.   [DISCONTINUED] Phenyleph-Doxylamine-DM-APAP (ALKA-SELTZER PLS ALLERGY & [facility] PO) Take by mouth. (Patient not taking: Reported on 12/08/2023)   [DISCONTINUED] rosuvastatin  (CRESTOR ) 10 MG tablet Take 1 tablet (10 mg total) by mouth daily.   No facility-administered encounter medications on file as of 12/22/2023.     Surgical History: Past Surgical History:  Procedure Laterality Date   ABDOMINAL HYSTERECTOMY Bilateral 1988   COLONOSCOPY N/A 09/15/2020   Procedure: COLONOSCOPY;  Surgeon: Therisa Bi, MD;  Location: [facility] ENDOSCOPY;  Service: Gastroenterology;  Laterality: N/A;   COLONOSCOPY N/A 10/13/2023   Procedure: COLONOSCOPY;  Surgeon: Therisa Bi, MD;  Location: [facility] ENDOSCOPY;  Service: Gastroenterology;  Laterality: N/A;  on Wegovy patient requesting morning appt   KNEE SURGERY Left 2014   POLYPECTOMY  10/13/2023   Procedure: POLYPECTOMY, INTESTINE;  Surgeon: Therisa Bi, MD;  Location: [facility] ENDOSCOPY;  Service: Gastroenterology;;    Medical History: Past Medical History:  Diagnosis Date   Depression    History of kidney stones    Hyperlipidemia    Hypertension    Obesity, Class III, BMI 40-49.9 (morbid obesity) (HCC) 11/10/2015    Family History: Family History  Problem Relation Age of Onset   Osteoarthritis Mother    Stroke Mother    Heart disease Father    Peripheral Artery Disease Sister    Diabetes Brother    Heart attack Brother    Diabetes Maternal Grandfather    Heart attack Paternal Uncle    Heart attack Paternal Uncle    Heart attack Paternal Uncle    Heart attack Paternal Uncle    Heart attack Paternal Uncle    Heart attack Paternal Uncle    Heart attack Paternal Uncle    Heart attack Brother    Atrial fibrillation Brother    Heart Problems Brother    Heart attack Brother  Stroke Brother    Heart disease Brother    Heart disease Other     Social History   Socioeconomic History   Marital status: Married    Spouse name: Danny   Number of children: 1   Years of education: Not on file   Highest education level: Some college, no degree  Occupational History   Occupation: Retired  Tobacco Use   Smoking status: Every Day    Current packs/day: 1.00    Average packs/day: 1 pack/day for 51.1 years (51.1 ttl pk-yrs)    Types: Cigarettes    Start date:  05/26/2016   Smokeless tobacco: Never   Tobacco comments:    down to 5 cigarettes daily   Vaping Use   Vaping status: Never Used  Substance and Sexual Activity   Alcohol use: Yes    Comment: rarely   Drug use: No   Sexual activity: Yes    Partners: Male  Other Topics Concern   Not on file  Social History Narrative   Not on file   Social Drivers of Health   Financial Resource Strain: Low Risk  (03/06/2021)   Overall Financial Resource Strain (CARDIA)    Difficulty of Paying Living Expenses: Not hard at all  Food Insecurity: No Food Insecurity (03/06/2021)   Hunger Vital Sign    Worried About Running Out of Food in the Last Year: Never true    Ran Out of Food in the Last Year: Never true  Transportation Needs: No Transportation Needs (03/06/2021)   PRAPARE - Administrator, Civil Service (Medical): No    Lack of Transportation (Non-Medical): No  Physical Activity: Sufficiently Active (03/06/2021)   Exercise Vital Sign    Days of Exercise per Week: 5 days    Minutes of Exercise per Session: 30 min  Stress: Stress Concern Present (03/06/2021)   [employer] of Occupational Health - Occupational Stress Questionnaire    Feeling of Stress : Very much  Social Connections: Moderately Integrated (03/06/2021)   Social Connection and Isolation Panel    Frequency of Communication with Friends and Family: Once a week    Frequency of Social Gatherings with Friends and Family: Once a week    Attends Religious Services: 1 to 4 times per year    Active Member of [employer] or Organizations: Yes    Attends Banker Meetings: 1 to 4 times per year    Marital Status: Married  Catering Manager Violence: Not At Risk (03/06/2021)   Humiliation, Afraid, Rape, and Kick questionnaire    Fear of Current or Ex-Partner: No    Emotionally Abused: No    Physically Abused: No    Sexually Abused: No      Review of Systems  Constitutional:  Negative for chills, fatigue and  unexpected weight change.  HENT:  Negative for congestion, rhinorrhea, sneezing and sore throat.   Eyes:  Negative for redness.  Respiratory:  Negative for cough, chest tightness and shortness of breath.   Cardiovascular:  Negative for chest pain and palpitations.  Gastrointestinal:  Negative for abdominal pain, constipation, diarrhea, nausea and vomiting.  Genitourinary:  Negative for dysuria and frequency.  Musculoskeletal:  Negative for arthralgias, back pain, joint swelling and neck pain.  Skin:  Negative for rash.  Neurological: Negative.  Negative for tremors and numbness.  Hematological:  Negative for adenopathy. Does not bruise/bleed easily.  Psychiatric/Behavioral:  Negative for behavioral problems (Depression), sleep disturbance and suicidal ideas. The patient is not nervous/anxious.  Vital Signs: BP 117/79   Pulse 86   Temp 98.1 F (36.7 C)   Resp 16   Ht 5' 2 (1.575 m)   Wt 200 lb (90.7 kg)   SpO2 95%   BMI 36.58 kg/m    Physical Exam Vitals reviewed.  Constitutional:      General: She is not in acute distress.    Appearance: Normal appearance. She is obese. She is not ill-appearing.  HENT:     Head: Normocephalic and atraumatic.  Eyes:     Pupils: Pupils are equal, round, and reactive to light.  Cardiovascular:     Rate and Rhythm: Normal rate and regular rhythm.  Pulmonary:     Effort: Pulmonary effort is normal. No respiratory distress.  Neurological:     Mental Status: She is alert and oriented to person, place, and time.  Psychiatric:        Mood and Affect: Mood normal.        Behavior: Behavior normal.        Assessment/Plan: 1. Centrilobular emphysema (HCC) (Primary) No intervention needed at this time. She is decreasing her smoking on her own without assistance. She is down to 5 cigarettes daily. She is aware that quitting will help improve her lung function. She is not currently having symptoms. No intervention or medication is needed at  this time. Patient agrees to call clinic if she experiences any SOB, wheezing, chest tightness or cough. Patient declines consult with pulmonology for now since her PFT was good. I am in agreement with this. Will schedule appointment with Dr. Elfreda Bathe if there are any changes.   2. Essential (primary) hypertension Stable, continue medication as prescribed.   3. Atherosclerosis of aorta Continue rosuvastatin  as prescribed.  - rosuvastatin  (CRESTOR ) 10 MG tablet; Take 1 tablet (10 mg total) by mouth daily.  Dispense: 90 tablet; Refill: 3   General Counseling: Gisell verbalizes understanding of the findings of todays visit and agrees with plan of treatment. I have discussed any further diagnostic evaluation that may be needed or ordered today. We also reviewed her medications today. she has been encouraged to call the office with any questions or concerns that should arise related to todays visit.    No orders of the defined types were placed in this encounter.   Meds ordered this encounter  Medications   rosuvastatin  (CRESTOR ) 10 MG tablet    Sig: Take 1 tablet (10 mg total) by mouth daily.    Dispense:  90 tablet    Refill:  3    Return for previously scheduled, AWV, Kemani Heidel PCP in april. .   Total time spent:30 Minutes Time spent includes review of chart, medications, test results, and follow up plan with the patient.   [place] Controlled Substance Database was reviewed by me.  This patient was seen by Mardy Maxin, FNP-C in collaboration with Dr. Sigrid Bathe as a part of collaborative care agreement.   Jaylise Peek R. Maxin, MSN, FNP-C Internal medicine

## 2024-01-03 NOTE — Procedures (Signed)
 [facility] [address] [place], [address]    Complete Pulmonary Function Testing Interpretation:  FINDINGS:  The forced vital capacity is normal.  FEV1 is normal.  FEV1 FVC ratio is normal.  Postbronchodilator there is no significant change in FEV1 however clinical improvement may occur in the absence of spirometric improvement.  Total lung capacity was mildly decreased.  Residual volume is decreased.  FRC is decreased.  DLCO was within normal limits.  IMPRESSION:  This pulmonary function study is consistent with mild restrictive lung disease clinical correlation is recommended.  Veronica DELENA Bathe, MD [facility] Pulmonary Critical Care Medicine Sleep Medicine

## 2024-01-04 LAB — PULMONARY FUNCTION TEST

## 2024-01-10 LAB — PULMONARY FUNCTION TEST

## 2024-01-18 ENCOUNTER — Encounter: Payer: Self-pay | Admitting: Nurse Practitioner

## 2024-04-17 ENCOUNTER — Ambulatory Visit: Admitting: Cardiovascular Disease

## 2024-05-29 ENCOUNTER — Ambulatory Visit: Admitting: Cardiovascular Disease

## 2024-06-11 ENCOUNTER — Ambulatory Visit: Admitting: Nurse Practitioner

## 2024-12-26 ENCOUNTER — Encounter: Admitting: Internal Medicine
